# Patient Record
Sex: Female | Born: 1970 | Race: White | Hispanic: No | Marital: Married | Smoking: Never smoker
Health system: Southern US, Community
[De-identification: ages and names within clinical notes are randomized; demographics above are authoritative.]

## PROBLEM LIST (undated history)

## (undated) DIAGNOSIS — M1990 Unspecified osteoarthritis, unspecified site: Secondary | ICD-10-CM

## (undated) DIAGNOSIS — G709 Myoneural disorder, unspecified: Secondary | ICD-10-CM

## (undated) DIAGNOSIS — I10 Essential (primary) hypertension: Secondary | ICD-10-CM

## (undated) HISTORY — PX: OTHER SURGICAL HISTORY: SHX169

## (undated) HISTORY — PX: ABDOMINAL HYSTERECTOMY: SHX81

---

## 2000-11-11 ENCOUNTER — Other Ambulatory Visit: Admission: RE | Admit: 2000-11-11 | Discharge: 2000-11-11 | Payer: Self-pay | Admitting: *Deleted

## 2002-10-17 ENCOUNTER — Encounter (HOSPITAL_COMMUNITY): Admission: RE | Admit: 2002-10-17 | Discharge: 2002-11-16 | Payer: Self-pay | Admitting: Family Medicine

## 2002-10-17 ENCOUNTER — Encounter: Payer: Self-pay | Admitting: Family Medicine

## 2005-08-24 ENCOUNTER — Ambulatory Visit (HOSPITAL_COMMUNITY): Admission: RE | Admit: 2005-08-24 | Discharge: 2005-08-24 | Payer: Self-pay | Admitting: *Deleted

## 2005-09-11 ENCOUNTER — Inpatient Hospital Stay (HOSPITAL_COMMUNITY): Admission: RE | Admit: 2005-09-11 | Discharge: 2005-09-13 | Payer: Self-pay | Admitting: *Deleted

## 2005-09-11 ENCOUNTER — Encounter (INDEPENDENT_AMBULATORY_CARE_PROVIDER_SITE_OTHER): Payer: Self-pay | Admitting: Specialist

## 2006-03-22 ENCOUNTER — Other Ambulatory Visit: Admission: RE | Admit: 2006-03-22 | Discharge: 2006-03-22 | Payer: Self-pay | Admitting: Obstetrics and Gynecology

## 2007-03-08 ENCOUNTER — Other Ambulatory Visit: Admission: RE | Admit: 2007-03-08 | Discharge: 2007-03-08 | Payer: Self-pay | Admitting: Obstetrics and Gynecology

## 2008-04-01 ENCOUNTER — Emergency Department (HOSPITAL_COMMUNITY): Admission: EM | Admit: 2008-04-01 | Discharge: 2008-04-01 | Payer: Self-pay | Admitting: Emergency Medicine

## 2009-05-25 HISTORY — PX: OTHER SURGICAL HISTORY: SHX169

## 2009-06-11 ENCOUNTER — Ambulatory Visit: Payer: Self-pay | Admitting: Sports Medicine

## 2009-06-11 DIAGNOSIS — M25559 Pain in unspecified hip: Secondary | ICD-10-CM

## 2009-06-11 DIAGNOSIS — M533 Sacrococcygeal disorders, not elsewhere classified: Secondary | ICD-10-CM | POA: Insufficient documentation

## 2009-07-16 ENCOUNTER — Ambulatory Visit: Payer: Self-pay | Admitting: Sports Medicine

## 2010-01-18 ENCOUNTER — Inpatient Hospital Stay (HOSPITAL_COMMUNITY): Admission: AC | Admit: 2010-01-18 | Discharge: 2010-01-30 | Payer: Self-pay

## 2010-02-27 ENCOUNTER — Ambulatory Visit (HOSPITAL_COMMUNITY): Admission: RE | Admit: 2010-02-27 | Discharge: 2010-02-27 | Payer: Self-pay | Admitting: Physician Assistant

## 2010-06-24 NOTE — Assessment & Plan Note (Signed)
Summary: NP RUNNER R HIP PAIN X OCT   Vital Signs:  Patient profile:   40 year old female Height:      64 inches Weight:      190 pounds BMI:     32.73 BP sitting:   136 / 85  Vitals Entered By: Lillia Pauls CMA (June 11, 2009 9:16 AM)  History of Present Illness: Pt presents with right lateral hip pain that radiates down her lateral thigh. This has been present since she finished a 5K in October. She rested after the 5K and her symptoms improved. However, when she started to run again a few weeks ago, the pain came back. She is usually able to run 1.5 miles before the pain starts and is able to run to 3-4 miles before she has to stop running. She denies any runs on a hilly course or sloped roads. No prior history of trauma or surgeries to her hip or back. She started running in April of 2010.   Allergies (verified): 1)  ! Pcn  Physical Exam  General:  alert and well-developed.   Head:  normocephalic and atraumatic.   Msk:  Bilateral Hips: No bony abnormalities, edema or bruising No TTP along the trochanteric bursa or in the groin Internal rotation of 20 degrees and external rotation of 25 degrees 5/5 strength with resisted hip flexion and abduction Pain with FABER testing in the right SI joint No pain with FABER testing in the left SI joint Decreased mobility on the right with pelvic tilt testing  Back: No scoliosis.  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 Given a list of stretches to do daily for her SI joint and low back including pretzel stretches 2. Can continue training  as tolerated 3. OTC NSAIDs as needed  Problem # 2:  HIP PAIN, RIGHT (ICD-719.45) Assessment: New Radiating from her right SI joint 1. See treatment plan above 2. Low back exercises for pain 3. Can consider manipulation if she would like to in the future 4. Return in 4 weeks  Patient Instructions: 1)  Do three of the sacro-iliac stretches each day with doing the pretzel stretch daily (lying on the side of the bed with your top leg going forward over the edge of the bed and your top arm behind you to stabilize you). 2)  You can continue your training as you can tolerated. 3)  You can take an over the counter anti-inflammatory as needed (like advil or motrin). 4)  If you feel like you are getting jammed, stop and stretch.  5)  Return in 4-6 weeks for follow-up.

## 2010-06-24 NOTE — Assessment & Plan Note (Signed)
Summary: F/U,MC   Vital Signs:  Patient profile:   40 year old female BP sitting:   139 / 87  Vitals Entered By: Lillia Pauls CMA (July 16, 2009 8:53 AM)  History of Present Illness: Pt presents for follow-up of right SI joint pain. She is 70% better than she was at her last office visit. She has been able to start jogging up to 4 miles now without having to stop because of pain. She felt a big difference in her ability to run 2.5 weeks after she started the SI joint stretches. She is not having to take any medications and she does not have to use ice or heat. She was able to complete a 5K well 10 days ago without needing to stop. She still denies any numbness and tingling down her legs. She only has to stretch every other day to maintain her back.   Allergies: 1)  ! Pcn  Physical Exam  General:  alert and well-developed.   Head:  normocephalic and atraumatic.   Neck:  supple.   Lungs:  normal respiratory effort.   Msk:  Back: No bony abnormalities, edema or bruising. No scoliosis. Full ROM of her back with forward flexion, extension, side to side and rotation without pain She can walk on her heels and toes easily No TTP along her cervical, thoracic or lumbar spine No TTP over her SI joints Neg FABER testing bilaterally but tighter on the right Neg SLR bilaterally Neurovascularly intact Normal sensation of bilateral LE 5/5 strength with resisted knee flexion and extension as well as with resisted big toe extension  Gait: Forefoot varus strike with walking and running Otherwise good running form   Impression & Recommendations:  Problem # 1:  SACROILIAC JOINT DYSFUNCTION (ICD-724.6) Assessment Improved Much improved 1. Continue current SI joint stretches at least 2-3 times per week to maintain her back 2. Can continue training as needed 3. Return as needed   Problem # 2:  HIP PAIN, RIGHT (ICD-719.45) Secondary to right SI joint pain that has now improved See  treatment plan above.

## 2010-08-07 LAB — CBC
HCT: 26.2 % — ABNORMAL LOW (ref 36.0–46.0)
HCT: 28.6 % — ABNORMAL LOW (ref 36.0–46.0)
HCT: 28.7 % — ABNORMAL LOW (ref 36.0–46.0)
HCT: 29.1 % — ABNORMAL LOW (ref 36.0–46.0)
Hemoglobin: 8.8 g/dL — ABNORMAL LOW (ref 12.0–15.0)
Hemoglobin: 9.5 g/dL — ABNORMAL LOW (ref 12.0–15.0)
Hemoglobin: 9.6 g/dL — ABNORMAL LOW (ref 12.0–15.0)
MCH: 26.8 pg (ref 26.0–34.0)
MCH: 27.2 pg (ref 26.0–34.0)
MCH: 27.3 pg (ref 26.0–34.0)
MCH: 27.6 pg (ref 26.0–34.0)
MCHC: 32.9 g/dL (ref 30.0–36.0)
MCHC: 33.1 g/dL (ref 30.0–36.0)
MCHC: 33.3 g/dL (ref 30.0–36.0)
MCHC: 33.6 g/dL (ref 30.0–36.0)
MCHC: 34 g/dL (ref 30.0–36.0)
MCV: 81 fL (ref 78.0–100.0)
MCV: 81.1 fL (ref 78.0–100.0)
MCV: 81.4 fL (ref 78.0–100.0)
MCV: 81.6 fL (ref 78.0–100.0)
Platelets: 313 10*3/uL (ref 150–400)
RBC: 3.22 MIL/uL — ABNORMAL LOW (ref 3.87–5.11)
RDW: 13.2 % (ref 11.5–15.5)
RDW: 13.3 % (ref 11.5–15.5)
RDW: 13.7 % (ref 11.5–15.5)
WBC: 11.2 10*3/uL — ABNORMAL HIGH (ref 4.0–10.5)
WBC: 17.1 10*3/uL — ABNORMAL HIGH (ref 4.0–10.5)

## 2010-08-07 LAB — BASIC METABOLIC PANEL
BUN: 12 mg/dL (ref 6–23)
BUN: 5 mg/dL — ABNORMAL LOW (ref 6–23)
BUN: 6 mg/dL (ref 6–23)
BUN: 9 mg/dL (ref 6–23)
CO2: 26 mEq/L (ref 19–32)
CO2: 26 mEq/L (ref 19–32)
CO2: 26 mEq/L (ref 19–32)
CO2: 27 mEq/L (ref 19–32)
Calcium: 7.7 mg/dL — ABNORMAL LOW (ref 8.4–10.5)
Calcium: 8.4 mg/dL (ref 8.4–10.5)
Calcium: 8.5 mg/dL (ref 8.4–10.5)
Chloride: 102 mEq/L (ref 96–112)
Chloride: 103 mEq/L (ref 96–112)
Chloride: 96 mEq/L (ref 96–112)
Creatinine, Ser: 0.69 mg/dL (ref 0.4–1.2)
Creatinine, Ser: 0.78 mg/dL (ref 0.4–1.2)
GFR calc Af Amer: >60 mL/min (ref >60)
GFR calc non Af Amer: >60 mL/min (ref >60)
Glucose, Bld: 100 mg/dL — ABNORMAL HIGH (ref 70–99)
Glucose, Bld: 102 mg/dL — ABNORMAL HIGH (ref 70–99)
Glucose, Bld: 118 mg/dL — ABNORMAL HIGH (ref 70–99)
Glucose, Bld: 128 mg/dL — ABNORMAL HIGH (ref 70–99)
Glucose, Bld: 374 mg/dL — ABNORMAL HIGH (ref 70–99)
Potassium: 3.3 mEq/L — ABNORMAL LOW (ref 3.5–5.1)
Potassium: 3.7 mEq/L (ref 3.5–5.1)
Potassium: 4.8 mEq/L (ref 3.5–5.1)
Sodium: 130 mEq/L — ABNORMAL LOW (ref 135–145)
Sodium: 133 mEq/L — ABNORMAL LOW (ref 135–145)
Sodium: 136 mEq/L (ref 135–145)

## 2010-08-07 LAB — WOUND CULTURE

## 2010-08-07 LAB — MAGNESIUM: Magnesium: 1.9 mg/dL (ref 1.5–2.5)

## 2010-08-07 LAB — URINALYSIS, ROUTINE W REFLEX MICROSCOPIC
Nitrite: NEGATIVE
Specific Gravity, Urine: 1.011 (ref 1.005–1.030)
pH: 6 (ref 5.0–8.0)

## 2010-08-07 LAB — URINE CULTURE

## 2010-08-08 LAB — POCT I-STAT 7, (LYTES, BLD GAS, ICA,H+H)
Acid-base deficit: 2 mmol/L (ref 0.0–2.0)
Calcium, Ion: 0.94 mmol/L — ABNORMAL LOW (ref 1.12–1.32)
Calcium, Ion: 1.14 mmol/L (ref 1.12–1.32)
O2 Saturation: 100 %
O2 Saturation: 100 %
Patient temperature: 35.6
Patient temperature: 36.1
Potassium: 3.6 meq/L (ref 3.5–5.1)
Potassium: 4.1 meq/L (ref 3.5–5.1)
Sodium: 138 meq/L (ref 135–145)
Sodium: 139 meq/L (ref 135–145)
TCO2: 24 mmol/L (ref 0–100)
TCO2: 25 mmol/L (ref 0–100)
pCO2 arterial: 37.4 mmHg (ref 35.0–45.0)
pCO2 arterial: 43.2 mmHg (ref 35.0–45.0)
pH, Arterial: 7.39 (ref 7.350–7.400)

## 2010-08-08 LAB — COMPREHENSIVE METABOLIC PANEL
AST: 215 U/L — ABNORMAL HIGH (ref 0–37)
Alkaline Phosphatase: 54 U/L (ref 39–117)
BUN: 14 mg/dL (ref 6–23)
CO2: 22 meq/L (ref 19–32)
Chloride: 108 meq/L (ref 96–112)
Creatinine, Ser: 0.93 mg/dL (ref 0.4–1.2)
GFR calc non Af Amer: >60 mL/min (ref >60)
Potassium: 3.2 meq/L — ABNORMAL LOW (ref 3.5–5.1)
Total Bilirubin: 0.3 mg/dL (ref 0.3–1.2)

## 2010-08-08 LAB — TYPE AND SCREEN: ABO/RH(D): A POS

## 2010-08-08 LAB — BASIC METABOLIC PANEL
BUN: 13 mg/dL (ref 6–23)
BUN: 7 mg/dL (ref 6–23)
BUN: 7 mg/dL (ref 6–23)
BUN: 9 mg/dL (ref 6–23)
CO2: 25 mEq/L (ref 19–32)
CO2: 26 mEq/L (ref 19–32)
CO2: 28 mEq/L (ref 19–32)
Calcium: 7.7 mg/dL — ABNORMAL LOW (ref 8.4–10.5)
Chloride: 107 mEq/L (ref 96–112)
Creatinine, Ser: 0.64 mg/dL (ref 0.4–1.2)
Creatinine, Ser: 0.73 mg/dL (ref 0.4–1.2)
Creatinine, Ser: 0.9 mg/dL (ref 0.4–1.2)
GFR calc non Af Amer: >60 mL/min (ref >60)
Glucose, Bld: 105 mg/dL — ABNORMAL HIGH (ref 70–99)
Glucose, Bld: 122 mg/dL — ABNORMAL HIGH (ref 70–99)
Glucose, Bld: 133 mg/dL — ABNORMAL HIGH (ref 70–99)
Glucose, Bld: 173 mg/dL — ABNORMAL HIGH (ref 70–99)
Potassium: 3.7 mEq/L (ref 3.5–5.1)
Potassium: 3.9 mEq/L (ref 3.5–5.1)
Potassium: 4.2 mEq/L (ref 3.5–5.1)
Sodium: 131 mEq/L — ABNORMAL LOW (ref 135–145)

## 2010-08-08 LAB — CBC
HCT: 26.5 % — ABNORMAL LOW (ref 36.0–46.0)
HCT: 27.7 % — ABNORMAL LOW (ref 36.0–46.0)
HCT: 29.6 % — ABNORMAL LOW (ref 36.0–46.0)
Hemoglobin: 12.2 g/dL (ref 12.0–15.0)
Hemoglobin: 8.9 g/dL — ABNORMAL LOW (ref 12.0–15.0)
MCH: 27.2 pg (ref 26.0–34.0)
MCH: 27.6 pg (ref 26.0–34.0)
MCH: 27.8 pg (ref 26.0–34.0)
MCH: 28.1 pg (ref 26.0–34.0)
MCHC: 33.2 g/dL (ref 30.0–36.0)
MCHC: 33.3 g/dL (ref 30.0–36.0)
MCHC: 33.6 g/dL (ref 30.0–36.0)
MCHC: 34.1 g/dL (ref 30.0–36.0)
MCV: 79.6 fL (ref 78.0–100.0)
MCV: 80.6 fL (ref 78.0–100.0)
Platelets: 183 10*3/uL (ref 150–400)
Platelets: 207 10*3/uL (ref 150–400)
Platelets: 243 10*3/uL (ref 150–400)
RBC: 4.34 MIL/uL (ref 3.87–5.11)
RDW: 13.6 % (ref 11.5–15.5)
RDW: 13.6 % (ref 11.5–15.5)
RDW: 13.9 % (ref 11.5–15.5)
WBC: 12.6 10*3/uL — ABNORMAL HIGH (ref 4.0–10.5)
WBC: 20.4 10*3/uL — ABNORMAL HIGH (ref 4.0–10.5)

## 2010-08-08 LAB — PROTIME-INR: INR: 1.14 (ref 0.00–1.49)

## 2010-08-08 LAB — PREPARE FRESH FROZEN PLASMA

## 2010-09-10 ENCOUNTER — Encounter (HOSPITAL_COMMUNITY)
Admission: RE | Admit: 2010-09-10 | Discharge: 2010-09-10 | Disposition: A | Discharge status: Home / Self Care | Payer: BC Managed Care – PPO | Source: Ambulatory Visit | Attending: General Surgery | Admitting: General Surgery

## 2010-09-10 ENCOUNTER — Other Ambulatory Visit (HOSPITAL_COMMUNITY): Payer: Self-pay | Admitting: General Surgery

## 2010-09-10 DIAGNOSIS — K469 Unspecified abdominal hernia without obstruction or gangrene: Secondary | ICD-10-CM

## 2010-09-10 LAB — CBC
MCV: 79.7 fL (ref 78.0–100.0)
Platelets: 282 10*3/uL (ref 150–400)
RDW: 12.9 % (ref 11.5–15.5)
WBC: 9.1 10*3/uL (ref 4.0–10.5)

## 2010-09-10 LAB — SURGICAL PCR SCREEN: Staphylococcus aureus: NEGATIVE

## 2010-09-10 LAB — BASIC METABOLIC PANEL
BUN: 14 mg/dL (ref 6–23)
GFR calc non Af Amer: >60 mL/min (ref >60)
Potassium: 3.8 mEq/L (ref 3.5–5.1)
Sodium: 135 mEq/L (ref 135–145)

## 2010-09-15 ENCOUNTER — Observation Stay (HOSPITAL_COMMUNITY)
Admission: RE | Admit: 2010-09-15 | Discharge: 2010-09-17 | Disposition: A | Discharge status: Home / Self Care | Payer: BC Managed Care – PPO | Source: Ambulatory Visit | Attending: General Surgery | Admitting: General Surgery

## 2010-09-15 DIAGNOSIS — K432 Incisional hernia without obstruction or gangrene: Principal | ICD-10-CM | POA: Insufficient documentation

## 2010-09-15 DIAGNOSIS — Z5331 Laparoscopic surgical procedure converted to open procedure: Secondary | ICD-10-CM | POA: Insufficient documentation

## 2010-09-15 DIAGNOSIS — Z01812 Encounter for preprocedural laboratory examination: Secondary | ICD-10-CM | POA: Insufficient documentation

## 2010-09-29 NOTE — Discharge Summary (Signed)
  NAMEKRISTOPHER, [name]               ACCOUNT NO.:  [id number]  MEDICAL RECORD NO.:  [id number]           PATIENT TYPE:  O  LOCATION:  5151                         FACILITY:  [facility]  PHYSICIAN:  Cherylynn Ridges, M.D.    DATE OF BIRTH:  Sep 09, 1970  DATE OF ADMISSION:  09/15/2010 DATE OF DISCHARGE:  09/17/2010                              DISCHARGE SUMMARY   DISCHARGE DIAGNOSIS:  Ventral hernia.  PRINCIPAL PROCEDURE:  Open ventral hernia repair with Physio mesh. Surgeon was Dr. Lindie Spruce.  DISPOSITION:  She is discharged to home in care of her family.  She has an abdominal binder in place which she is to wear anytime she is ambulating.  She also has a Blake drain in her subcutaneous tissue above the fascia which will be cared for by the family.  No home health is necessary.  We will probably remove that next week.  She is return to see me on Tuesday, I believe that is Sep 23, 2010 for staple removal and likely removal of drain.  She is stable on discharge.  She is afebrile and eating well.  BRIEF SUMMARY OF HOSPITAL COURSE:  The patient was admitted postoperatively after an attempted laparoscopic converted to an open ventral hernia repair with Physio mesh.  The patient had a large central fascial defect which was closed on top of an inlay piece of Physio mesh.  Postoperatively on day #1, she had her Foley removed.  She has been able to void well since that time.  Because she had a Blake drain in place and was draining significantly, we left it in place and just got the patient used to her pain with ambulation and binder and so forth.  On postoperative day #2, she is eating well, passing gas, and no bowel movement yet.  Her wound looks good with no evidence of necrosis or infection.  Her Harrison Mons drain is still working very well.  She is to return to clinic to see me on Tuesday for staple removal and drain removal.     Cherylynn Ridges, M.D.     JOW/MEDQ  D:  09/17/2010  T:  09/18/2010   Job:  [id number]  Electronically Signed by Jimmye Norman M.D. on 09/29/2010 04:05:07 PM

## 2010-09-29 NOTE — Op Note (Signed)
[name], [name]               ACCOUNT NO.:  [id number]  MEDICAL RECORD NO.:  [id number]           PATIENT TYPE:  O  LOCATION:  5151                         FACILITY:  [facility]  PHYSICIAN:  Cherylynn Ridges, M.D.    DATE OF BIRTH:  May 28, 1970  DATE OF PROCEDURE:  09/15/2010 DATE OF DISCHARGE:                              OPERATIVE REPORT   PREOPERATIVE DIAGNOSIS:  Incisional ventral hernia.  POSTOPERATIVE DIAGNOSIS:  A 15 x 20 cm ventral incisional hernia.  PROCEDURE:  Aborted laparoscopic and open ventral hernia repair with mesh.  SURGEON:  Marta Lamas. Lindie Spruce, MD  ANESTHESIA:  General endotracheal.  ESTIMATED BLOOD LOSS:  250-300 mL.  COMPLICATIONS:  None.  CONDITION:  Stable.  FINDINGS:  Very large separated fascia of ventral hernia in the upper portion of previous abdominal incision for trauma.  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  OPERATION:  The patient was taken to the operating room and placed on table in supine position.  After an adequate general endotracheal anesthetic was administered, she was prepped and draped in usual sterile manner exposing the upper midline and the entire abdomen.  After proper time-out was performed identifying the patient and procedure to be done, we made an incision in the right upper quadrant about 1.2 cm in length down into the subcutaneous tissue.  It was there where we used an OptiVu cannula, 11/12 mm cannula with an inserted laparoscope, attached camera, and light source and carefully went into the peritoneal cavity.  We easily able to get in without injuring any of the small bowel or bowel structures and then we were able to insufflate with carbon dioxide gas up to maximal pressure of 50 mmHg.  With the laparoscope, attached camera, and light source, we were able to identify that this is a very widely separated fascial defects  with pieces of omentum and colon involved in the hernia defect.  With the size of this defect and the amount of adhesions to the anterior abdominal wall, it was felt so as probably better to fix it openly and we did so.  We removed the camera, cut out the insufflation but left the gas in place as we made our midline incision after appropriate remnants were encountered.  The midline incision was made excising the previous sort of hypertrophic scar in the upper portion of the wound down to the umbilicus.  We took it down into the subcutaneous tissue and then into the peritoneal cavity where we initially got into the hernia sac.  We excised the keloid tissue.  We were able to identify the fascia circumferentially.  Inferiorly, there was a small defect around the umbilicus which we also incorporated into the ventral hernia repair.  We made flaps laterally on both sides using electrocautery identifying the fascia.  The fascial opening where the trocar went in on the right side was closed off using a figure-of-eight stitch of 0 Vicryl.  There wasalso a fascial laceration made with the cautery which was repaired also.  Once we had this  repaired, we dissected out flaps circumferentially.  We measured the size of the defect, it was about 15 x 20 cm.  The decision was made to use a 20 x 25 cm piece of Physiomesh which we attached circumferentially after cutting off about a centimeter and half circumferentially leaving it in an oval shape.  We attached it with using horizontal mattress U stitches circumferentially using approximately 32 stitches to attach it to the underlying surface of the fascia.  Once we had it completely in place, we had to place a couple of tacking stitches to cut off any areas of fascia that were not attached to the mesh.  We then reapproximated the fascia on top of the mesh using interrupted #1 Novafil sutures.  We irrigated with saline solution and placed a 19 [place] Blake  drain in the subcutaneous tissue which we brought out through the subcu side of the trocar.  We stapled out most of the trocar site and sutured the drain in place using a 2-0 nylon suture.  We irrigated and soaked the mesh in antibiotic solution.  We reapproximated the subcu using running 3-0 Vicryl suture.  Then we closed the skin using stainless steel staples.  All counts were correct including needles, sponges and instruments.  A sterile dressing applied using antibiotic ointment and 4x4s.     Cherylynn Ridges, M.D.     JOW/MEDQ  D:  09/15/2010  T:  09/16/2010  Job:  [id number]  Electronically Signed by Jimmye Norman M.D. on 09/29/2010 04:05:11 PM

## 2010-10-10 NOTE — Discharge Summary (Signed)
[name], [name]               ACCOUNT NO.:  [id number]   MEDICAL RECORD NO.:  [id number]          PATIENT TYPE:  INP   LOCATION:  A418                          FACILITY:  [facility]   PHYSICIAN:  Langley Gauss, MD     DATE OF BIRTH:  Aug 21, 1970   DATE OF ADMISSION:  09/11/2005  DATE OF DISCHARGE:  04/22/2007LH                                 DISCHARGE SUMMARY   PROCEDURE PERFORMED:  TAH and left ovarian cystectomy.   DISPOSITION:  At time of discharge the Foley catheter was out, the patient  is fully ambulatory.  JP drain and subcutaneous fat retention sutures are  removed.  The patient is given copy of standard discharge instructions.  She  is to follow up in the office in three to four days' time for removal of  staples from the Pfannenstiel incision.   DISCHARGE MEDICATIONS:  Tylox #30 without refill for pain relief.   PERTINENT LABORATORY STUDIES:  A+ blood type.  Admission  hemoglobin/hematocrit 14 and 40.9 with a negative HCG.  Postoperative day  #1, 10.4 and 30.3 with a white count of 8.   HOSPITAL COURSE:  TAH and left ovarian cystectomy performed without  complication on September 11, 2005.  The patient did well postoperatively.  Vital signs remained stable.  She had a Foley catheter in place which was  removed on postop day #1, after which time the patient was fully ambulatory  and able to void without difficulty.  She continued to have stable vital  signs.  Advance her diet.  On postop day #2, the patient has resumption of  bowel function with passage of flatus, doing well with p.o. narcotics, no  complaints of hot flashes.  Incision appears to be well approximated with no  erythema or induration.  The patient discharged home on postoperative day  #2.      Langley Gauss, MD  Electronically Signed     DC/MEDQ  D:  09/20/2005  T:  09/21/2005  Job:  [id number]

## 2010-10-10 NOTE — Op Note (Signed)
[name], [name]               ACCOUNT NO.:  [id number]   MEDICAL RECORD NO.:  [id number]          PATIENT TYPE:  INP   LOCATION:  A418                          FACILITY:  [facility]   PHYSICIAN:  Langley Gauss, MD     DATE OF BIRTH:  1971/05/21   DATE OF PROCEDURE:  09/11/2005  DATE OF DISCHARGE:  09/13/2005                                 OPERATIVE REPORT   PREOPERATIVE DIAGNOSES:  1.  Menorrhagia.  2.  Dysmenorrhea.  3.  Uterine leiomyoma.   PROCEDURE PERFORMED:  Total abdominal hysterectomy and left ovarian  cystectomy.   SURGEON:  Langley Gauss, M.D.   ESTIMATED BLOOD LOSS:  300 mL.   ANESTHESIA:  General endotracheal.   DRAINS:  Foley catheter sterilely draining clear, yellow urine from the  bladder.  JP drain was placed in a subcutaneous space.   FINDINGS AT SURGERY:  Include a nulliparous uterus with at least a one small  fibroid identified on the exterior surface but with somewhat globular fundus  noted.  Incidental finding was hemorrhagic ovarian cyst which was noted to  incidentally rupture, and there was noted to be a small amount of active  bleeding.  A portion of the left ovarian cyst wall was then removed, and a  pursestring suture was used to close the defect and control the bleeding.   SUMMARY:  Planned procedure discussed with the patient in the preoperative  holding area.  On previous discussions, she was adamant regarding her desire  to retain her ovaries and avoid surgical menopause and also thus avoid use  of hormone replacement therapy.  The patient's vital signs were stable.  She  was taken to the operating room and underwent uncomplicated induction of  general anesthesia, then prepped and draped usual sterile manner.  Foley  catheter was sterilely placed to straight drainage, findings of clear,  yellow urine.  A knife was used to incise the previous Pfannenstiel incision  and was dissected down to the fascial plane utilizing the knife and cautery,  cauterizing all bleeders along the way.  Of note was a significant amount of  venous oozing from the subcutaneous fatty tissue edges as well as the skin  edges.  These were cauterized.  The fascia was then incised in a transverse  curvilinear manner while sharply dissecting off the underlying rectus muscle  in the avascular plane.  Fascial edges were grasped using straight Kocher  clamps, incised, and the fascia then dissected off the underlying rectus  muscle in the midline both superiorly and inferiorly in the avascular plane.  Rectus muscles then bluntly separated.  Peritoneal cavity was atraumatically  bluntly entered at the superior-most portion of the incision.  Peritoneal  incision extended superiorly and inferiorly.  Inferiorly, we palpated the  bladder to avoid accidental injury.  Findings as described above.  Self-  retaining Balfour retractor was then placed.  Bladder blade was also placed.  One moist pack was used to mobilize bowel out of the operative field.  Straight Kocher clamps were used to grasp the specimen at the junction of  the round ligament and  fallopian tube with the uterus.  This allowed  manipulation throughout the procedure.  A Kelly clamp was placed on the  right round ligament to control back bleeding, followed by 0 Vicryl suture  in a Heaney fashion.  Right round ligament was transected, and the utero-  ovarian ligament on the right was skeletonized.  Utero-ovarian ligament was  then doubly clamped with Kelly clamps.  Transection was then performed  between the two, and this pedicle was then doubly ligated first with a free  tie of 0 Vicryl, followed by suture ligature of 0 Vicryl in a Heaney  fashion.  On the patient's left, procedure was performed identically.  There  was noted to be some omental adhesions to the fundus from previous surgery  which were sharply dissected free.  The densest of these, however, was  ligated by placing Kelly clamps and then two  free ties of 0 Vicryl to secure  the edges.  This mobilized omentum off of the fundal portion of the uterus.  Left round ligament was clamped with a Kelly clamp.  Suture ligature of 0  Vicryl in Heaney fashion was then utilized to secure the left round  ligament.  The left round ligament was transected.  Skeletization of the  utero-ovarian ligament was then performed.  Utero-ovarian ligament was then  doubly clamped with Kelly clamps followed by double ligation, first with  free tie of 0 Vicryl, followed by suture ligature of 0 Vicryl in a Heaney  fashion.  Of note, during manipulation of the left ovary, there was noted to  be incidental rupture of hemorrhagic ovarian cysts with initial evacuation  of a very small clot, followed by some active bleeding which was minimal in  nature and thus was at a later time.  On each side then, the uterine vessels  were skeletonized in the avascular plane, and the dissection was continued  across the anterior lower segment in the avascular plane.  The bladder flap  was then pushed distal to the cervix.  The right uterine vessels identified,  and Kelly clamp was placed to control back-bleeding.  Suture ligature of 0  Vicryl was then placed after curved Heaney was placed.  This secured the  right uterine vessel.  The left uterine vessel was identified.  A curved  Heaney clamp was placed on the left uterine vessel close to the cervix at  the junction of the cervix and the body of the uterus.  Left uterine vessel  was secured with 0 Vicryl in a single interrupted fashion.  This resulted in  hemostasis and interruption of the major vascular supply to the uterus  itself.  The right cardinal ligament was then identified.  A straight Heaney  clamp was placed on this very close to the cervix.  This was then ligated  with 0 Vicryl in a Heaney fashion.  Left cardinal ligament was likewise  clamped with a curved Heaney clamp, placed very close to the cervix.  A  0 Vicryl suture in a Heaney fashion was placed at this site, and this was also  tagged for later inspection but then cut.  The right uterosacral ligament  was clamped with a curved Heaney clamp, followed by suture ligature of 0  Vicryl in a Heaney fashion.  This was tagged for later inspection.  Left  uterosacral ligament identified and clamped with a curved Haney clamp,  followed by suture ligature of 0 Vicryl in a Heaney fashion.  These are  tagged for later  inspection.  This allowed me to cut across the vaginal  cuff, maintaining maximum vaginal length but removing the entirety of the  cervix with the uterus.  The edges of the vaginal cuff were then grasped  with straight Kochers.  Irrigation of the pelvic cavity and upper vagina was  then performed with a Betadine solution.  The vaginal cuff was then closed  utilizing figure-of-eight sutures of 0 Vicryl to secure hemostasis.  Hemostasis being assured and our ureters noted to be in their normal  anatomic position along the lateral pelvic sidewall, I was able to complete  the closure.  Balfour retractor was removed as well as the moist pack.  Sponge and instrument counts were correct times two at this point.  In an  effort to avoid subsequent dyspareunia, the ovaries were slightly elevated  out of the pelvis by placing a single suture through the ovary and attaching  this to the round ligament.  No tension was applied in the process of doing  this.  This was noted to slightly suspend the ovaries and keep them from  falling into the cul-de-sac.  A pursestring suture was placed on the left  ovary after removal of a small portion of the left ovarian cyst wall to  control hemostasis.  The procedure was now terminated with sponge, needle,  and instrument counts correct.  All of our instruments were removed.  Peritoneal edges were grasped using Kelly clamps, and the peritoneum was  closed with a continuous running 0 chromic suture.  Likewise,  the rectus  muscle overlying this was then closed with a continuous running 0 chromic.  Significant amount of oozing had been occurring from the rectus muscle  throughout the procedure, which required meticulous cauterization of these  and pressure.  Surgicel had been placed around the ovaries in an effort to  prevent postoperative adhesion formation.  Surgicel was also placed in the  subfascial area in an effort to assure hemostasis.  This having been done,  the fascia was then closed with a continuous running looped #1 PDS suture.  Subcutaneous bleeders were cauterized.  JP drain was placed in subcutaneous  space, with a separate exit wound to the left apex of the incision.  This  was sutured into place.  Three horizontal mattress sutures of #1 PDS were  then placed as retention type sutures.  The skin was then reapproximated  utilizing skin staples.  Total of 30 mL of 0.5% bupivacaine plain was then  injected along the entirety of the incision to facilitate postoperative anesthesia.  The patient tolerated the procedure well.  She continued to  drain clear, yellow urine.  She was extubated and taken to the recovery room  in stable condition.  I spoke with the patient's family f the findings at  time of surgery including the very easy of oozing from the tissue edges.  Question at that point in time was whether the dietary supplements through  [facility] that the patient had been taking may have had an effect on  it, and I advised him that without knowing the exact ingredients in these, I  certainly would not be surprised if it did have some blood-thinning  properties, and they should have been discontinued much prior.      Langley Gauss, MD  Electronically Signed     DC/MEDQ  D:  09/11/2005  T:  09/13/2005  Job:  [id number]

## 2010-10-10 NOTE — H&P (Signed)
NAME:  [name], [name]               ACCOUNT NO.:  [id number]   MEDICAL RECORD NO.:  [id number]          PATIENT TYPE:  AMB   LOCATION:  DAY                           FACILITY:  [facility]   PHYSICIAN:  Langley Gauss, MD     DATE OF BIRTH:  1971-03-31   DATE OF ADMISSION:  09/11/2005  DATE OF DISCHARGE:  LH                                HISTORY & PHYSICAL   ADDENDUM:  Past medical history pertinent for unruptured appendectomy at age  40. T&A at age 62. Patient had nasal septal repair in 2000.  She underwent  breast reduction surgery February 23, 2005.   She has recently taken Megace but this has been discontinued due to the  scalp lesions, she is currently on Bactrim and these are resolving fairly  rapidly.      Langley Gauss, MD  Electronically Signed     DC/MEDQ  D:  09/10/2005  T:  09/10/2005  Job:  [id number]   cc:   [facility] Day [facility] Unit

## 2010-10-10 NOTE — H&P (Signed)
NAME:  [name], [name]               ACCOUNT NO.:  [id number]   MEDICAL RECORD NO.:  [id number]          PATIENT TYPE:  AMB   LOCATION:  DAY                           FACILITY:  [facility]   PHYSICIAN:  Langley Gauss, MD     DATE OF BIRTH:  1971-02-20   DATE OF ADMISSION:  09/11/2005  DATE OF DISCHARGE:  LH                                HISTORY & PHYSICAL   PLANNED PROCEDURE:  Total abdominal hysterectomy.   CHIEF COMPLAINT:  Menometrorrhagia and she is noted to have uterine  fibroids.  Specifically, the patient states her menses are irregular, that  she experiences 5-7 days of flow with passage of clots, occasionally has to  wear both a pad and a tampon.  In addition, she experiences significant  pelvic cramping during these episodes.  The patient had previously been on  Estrostep since March 2005, up to the past six months, her menses has not  been much of a problem, but subsequently, they have become very problematic  in that they interfere with her daily functioning and, at times, have kept  her nearly bedridden.  She has tried non-steroidal anti-inflammatory agents  with minimal improvement in the cramps and minimal decrease in the total  flow.  She currently continues to take Estrostep without any interruption to  August 12, 2005, however at that point in time, the Estrostep was not having  any significant therapeutic advantage to her, so it was discontinued and she  was started on Megace 20 mg p.o. daily suppressive therapy.  However, she  had significant problems with the Megace, most specifically development of  inclusion cysts on her scalp.  Thus, at present, she is not on any birth  control.  She is absolutely certain regarding her desire for no child  bearing potential.  She has no living children.  Her husband is in agreement  with this.  Pertinently, she does have PMS symptoms prior to the onset of  each of these episodes of vaginal bleeding.  She does have a history of  hypertension.  She does take Procardia XL 120 mg p.o. daily.  She also takes  Claritin for seasonal rhinitis.  She is also currently on Bactrim for  treatment of the scalp cysts.   ALLERGIES:  She states she is allergic to PENICILLIN, but has previously  tolerated cephalosporins without difficulty.   PAST MEDICAL HISTORY:  Pertinent for prior finding of a pelvic mass October 31, 1998.  Ultrasound at that time was very impressive for a large right adnexal  mass, 8.9 cm in diameter.  Laparotomy was performed with findings of a  pedunculated fibroid which was excised.  Subsequently, the patient had no  menstrual complaints until the past six months.   REVIEW OF SYSTEMS:  CONSTITUTIONAL:  The patient is experiencing intentional  weight loss, having lost at least 40 pounds.  ENDOCRINE:  She denies any hot  flashes.  CARDIOVASCULAR:  She does describe a history of hypertension.  No  history of diabetes.   FAMILY HISTORY:  Pertinent for a history of thyroid disease.   SOCIAL  HISTORY:  The patient is a non-smoker.  She is employed at [employer].  Her husband is named Fish farm manager.   PHYSICAL EXAMINATION:  GENERAL:  She is noted to be a very pleasant white female.  VITAL SIGNS:  Blood pressure 155/97, pulse 91.  Recent blood pressure was  124/72.  Weight 195 pounds.  HEENT:  Negative, there is no adenopathy.  NECK:  Supple, thyroid not palpable.  LUNGS:  Clear.  HEART:  Regular rate and rhythm.  ABDOMEN:  Soft, nontender.  She does have Pfannenstiel incision from prior  surgical removal of pedunculated fibroid.  EXTREMITIES:  Normal.  PELVIC EXAMINATION:  A diffusely enlarged uterus with a very prominent  fundal portion.  There is a very prominent fundus on palpation which is some  of the pain the patient experiences with onset of her menses.  Endometrial  stripe is normal at 6 mm.  The fibroid seated within the fundus measures 1.7  x 1.3 x 1.5 cm.   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  This is sufficiently  serious enough symptomatically at this time that she desires definitive  surgical management.  Pertinently, she is gravida 0, para 0, but she is  absolutely certain regarding her decision for no child bearing potential and  husband is in agreement with this.  She has never had any problems with her  ovaries, most specifically, no prior hemorrhagic cysts or adnexal type pain.  Thus, the plan is to proceed with total abdominal hysterectomy,  specifically, removal of the uterus and cervix.  The ovaries will be left in  place.  Attempts will be made to suspend them from the round ligaments to  leave them in their normal anatomic position and to avoid dyspareunia.  The  risks and benefits of the surgical procedure were discussed with the patient  to include the risks of bowel or bladder injury.  She is advised that this  is to be considered a very low risk for requiring transfusion.      Langley Gauss, MD  Electronically Signed     DC/MEDQ  D:  09/10/2005  T:  09/10/2005  Job:  [id number]

## 2011-01-29 ENCOUNTER — Other Ambulatory Visit (HOSPITAL_COMMUNITY): Payer: Self-pay | Admitting: Orthopedic Surgery

## 2011-01-29 DIAGNOSIS — S52599A Other fractures of lower end of unspecified radius, initial encounter for closed fracture: Secondary | ICD-10-CM

## 2011-02-02 ENCOUNTER — Ambulatory Visit (HOSPITAL_COMMUNITY)
Admission: RE | Admit: 2011-02-02 | Discharge: 2011-02-02 | Disposition: A | Discharge status: Home / Self Care | Payer: BC Managed Care – PPO | Source: Ambulatory Visit | Attending: Orthopedic Surgery | Admitting: Orthopedic Surgery

## 2011-02-02 DIAGNOSIS — Z9889 Other specified postprocedural states: Secondary | ICD-10-CM | POA: Insufficient documentation

## 2011-02-02 DIAGNOSIS — S52599A Other fractures of lower end of unspecified radius, initial encounter for closed fracture: Secondary | ICD-10-CM

## 2011-02-02 DIAGNOSIS — IMO0002 Reserved for concepts with insufficient information to code with codable children: Secondary | ICD-10-CM | POA: Insufficient documentation

## 2011-02-02 DIAGNOSIS — M25539 Pain in unspecified wrist: Secondary | ICD-10-CM | POA: Insufficient documentation

## 2011-03-03 ENCOUNTER — Other Ambulatory Visit (HOSPITAL_COMMUNITY): Payer: Self-pay | Admitting: Obstetrics and Gynecology

## 2011-03-03 DIAGNOSIS — Z139 Encounter for screening, unspecified: Secondary | ICD-10-CM

## 2011-03-09 ENCOUNTER — Ambulatory Visit (HOSPITAL_COMMUNITY)
Admission: RE | Admit: 2011-03-09 | Discharge: 2011-03-09 | Disposition: A | Discharge status: Home / Self Care | Payer: BC Managed Care – PPO | Source: Ambulatory Visit | Attending: Obstetrics and Gynecology | Admitting: Obstetrics and Gynecology

## 2011-03-09 DIAGNOSIS — Z139 Encounter for screening, unspecified: Secondary | ICD-10-CM

## 2011-03-09 DIAGNOSIS — Z1231 Encounter for screening mammogram for malignant neoplasm of breast: Secondary | ICD-10-CM | POA: Insufficient documentation

## 2011-03-12 ENCOUNTER — Other Ambulatory Visit: Payer: Self-pay | Admitting: Obstetrics and Gynecology

## 2011-03-12 DIAGNOSIS — R928 Other abnormal and inconclusive findings on diagnostic imaging of breast: Secondary | ICD-10-CM

## 2011-03-25 ENCOUNTER — Ambulatory Visit (HOSPITAL_COMMUNITY)
Admission: RE | Admit: 2011-03-25 | Discharge: 2011-03-25 | Disposition: A | Discharge status: Home / Self Care | Payer: BC Managed Care – PPO | Source: Ambulatory Visit | Attending: Obstetrics and Gynecology | Admitting: Obstetrics and Gynecology

## 2011-03-25 DIAGNOSIS — R928 Other abnormal and inconclusive findings on diagnostic imaging of breast: Secondary | ICD-10-CM

## 2011-04-15 ENCOUNTER — Encounter (HOSPITAL_COMMUNITY): Payer: BC Managed Care – PPO

## 2011-08-17 ENCOUNTER — Ambulatory Visit: Payer: BC Managed Care – PPO | Attending: Neurology | Admitting: Sleep Medicine

## 2011-08-17 DIAGNOSIS — G4730 Sleep apnea, unspecified: Secondary | ICD-10-CM

## 2011-08-17 DIAGNOSIS — Z6832 Body mass index (BMI) 32.0-32.9, adult: Secondary | ICD-10-CM | POA: Insufficient documentation

## 2011-08-17 DIAGNOSIS — G4710 Hypersomnia, unspecified: Secondary | ICD-10-CM | POA: Insufficient documentation

## 2011-08-22 NOTE — Procedures (Signed)
NAME:  [name], MASK               ACCOUNT NO.:  [id number]  MEDICAL RECORD NO.:  [id number]          PATIENT TYPE:  OUT  LOCATION:  SLEEP LAB                     FACILITY:  [facility]  PHYSICIAN:  Amarian Botero A. Gerilyn Pilgrim, M.D. DATE OF BIRTH:  1970/07/08  DATE OF STUDY:  08/17/2011                           NOCTURNAL POLYSOMNOGRAM  REFERRING PHYSICIAN:  [name] A. Gerilyn Pilgrim, M.D.  INDICATION FOR STUDY:  A 41 year old who presents with fatigue, snoring, hypertension, and obesity.  The study is being done to evaluate for obstructive sleep apnea syndrome.  EPWORTH SLEEPINESS SCORE: 1. BMI 32.  MEDICATIONS:  Lisinopril, nifedipine, aspirin.  SLEEP ARCHITECTURE:  The total recording time is 403 minutes, sleep efficiency 88%, sleep latency 12 minutes, REM latency 182 minutes. Stage N1 7%, N2 65%, N3 14%, and REM sleep 14%.  RESPIRATORY DATA:  Baseline oxygen saturation is 98, lowest saturation 92 during both non-REM sleep and REM sleep.  Diagnostic AHI is 1 and RDI 6.  CARDIAC DATA:  Average heart rate is 69 with no significant dysrhythmias observed.  MOVEMENT-PARASOMNIA:  PLM index 0.  IMPRESSIONS-RECOMMENDATIONS:  Unremarkable nocturnal polysomnography.     [name] A. Gerilyn Pilgrim, M.D.    KAD/MEDQ  D:  08/22/2011 15:59:39  T:  08/22/2011 22:58:31  Job:  [id number]

## 2013-03-08 ENCOUNTER — Telehealth (HOSPITAL_COMMUNITY): Payer: Self-pay | Admitting: Emergency Medicine

## 2013-03-08 NOTE — Telephone Encounter (Signed)
Called pt back and she has questions regarding her previous surgery. Let her know I need to pull her chart from storage and will call her back once I receive it. Advised may take a day or so.

## 2013-03-14 NOTE — Telephone Encounter (Signed)
Tried calling patient back but no answer or VM to leave msg. Received chart from storage. Read over previous notes and could not see anything regarding damage to her pancreas or spleen. There was a laceration of her liver that was closed by suture.

## 2013-03-29 ENCOUNTER — Telehealth (HOSPITAL_COMMUNITY): Payer: Self-pay | Admitting: Emergency Medicine

## 2013-03-29 NOTE — Telephone Encounter (Signed)
Left message directing her to call [facility] directly if her question involved her VHR in 2012. Otherwise she should call me back.

## 2017-12-07 ENCOUNTER — Ambulatory Visit: Payer: Self-pay | Admitting: Orthopedic Surgery

## 2017-12-07 NOTE — H&P (Signed)
[name] is an 47 y.o. female.   Chief Complaint: back and leg pain HPI: Reported by patient. Reason for Visit: Diagnositc Results (lumbar MRI)  Context: The patient is 10 weeks out from when symptoms began.  Location (Lower Extremity): leg pain bilateral, ,  Severity: pain level 7/10  Timing: constant  Alleviating Factors: changing positions  Associated Symptoms: numbness/tingling (right lower extremity)  Medications: The patient is taking Gabapentin and Vimovo.  Past Medical Hx HTN  Past surgical hx ORIF - left wrist - then removal of hardwre Splenectomy Adenoid Surgery Reduction mammoplasty Appendectomy  Social Hx Smoking Status: Never smoker Occupation: Special education teachers Alcohol intake: Occasional Hand Dominance: Right Work related injury?: N Advance directive: N  Family Hx Father - Hypertensive disorder Mother - Diabetes mellitus  Allergies:  Allergies  Allergen Reactions  . Penicillins Rash    Rash as a child. No swelling or anaphylactic reaction.   Medications fluticasone propionate 50 mcg/actuation nasal spray,suspension gabapentin 300 mg capsule lisinopril 5 mg tablet NIFEdipine ER 60 mg tablet,extended release 24 hr oxyCODONE-acetaminophen 5 mg-325 mg tablet Vimovo 500 mg-20 mg tablet,immediate and delay release  Review of Systems  Constitutional: Negative.   HENT: Negative.   Eyes: Negative.   Respiratory: Negative.   Cardiovascular: Negative.   Gastrointestinal: Negative.   Genitourinary: Negative.   Musculoskeletal: Positive for back pain.  Skin: Negative.   Neurological: Positive for sensory change and focal weakness.  Psychiatric/Behavioral: Negative.     There were no vitals taken for this visit. Physical Exam  Constitutional: She is oriented to person, place, and time. She appears well-developed and well-nourished.  HENT:  Head: Normocephalic.  Eyes: Pupils are equal, round, and reactive to light.  Neck: Normal range  of motion.  Cardiovascular: Normal rate.  Respiratory: Effort normal.  GI: Soft.  Musculoskeletal:  Patient is a 47 year old female.  Gait and Station: Appearance: ambulating with no assistive devices and antalgic gait.  Constitutional: General Appearance: healthy-appearing and distress (mild).  Psychiatric: Mood and Affect: active and alert.  Cardiovascular System: Edema Right: none; Dorsalis and posterior tibial pulses 2+. Edema Left: none.  Abdomen: Inspection and Palpation: non-distended and no tenderness.  Skin: Inspection and palpation: no rash.  Lumbar Spine: Inspection: normal alignment. Bony Palpation of the Lumbar Spine: tender at lumbosacral junction.. Bony Palpation of the Right Hip: no tenderness of the greater trochanter and tenderness of the SI joint; Pelvis stable. Bony Palpation of the Left Hip: no tenderness of the greater trochanter and tenderness of the SI joint. Soft Tissue Palpation on the Right: No flank pain with percussion. Active Range of Motion: limited flexion and extention.  Motor Strength: L1 Motor Strength on the Right: hip flexion iliopsoas 5/5. L1 Motor Strength on the Left: hip flexion iliopsoas 5/5. L2-L4 Motor Strength on the Right: knee extension quadriceps 5/5. L2-L4 Motor Strength on the Left: knee extension quadriceps 5/5. L5 Motor Strength on the Right: ankle dorsiflexion tibialis anterior 5/5 and great toe extension extensor hallucis longus 5/5. L5 Motor Strength on the Left: ankle dorsiflexion tibialis anterior 5/5 and great toe extension extensor hallucis longus 5/5. S1 Motor Strength on the Right: plantar flexion gastrocnemius 5/5. S1 Motor Strength on the Left: plantar flexion gastrocnemius 5/5.  Neurological System: Knee Reflex Right: normal (2). Knee Reflex Left: normal (2). Ankle Reflex Right: normal (2). Ankle Reflex Left: normal (2). Babinski Reflex Right: plantar reflex absent. Babinski Reflex Left: plantar reflex absent. Sensation on the  Right: normal distal extremities. Sensation on the  Left: normal distal extremities. Special Tests on the Right: no clonus of the ankle/knee and seated straight leg raising test positive. Special Tests on the Left: no clonus of the ankle/knee and seated straight leg raising test positive.  Trace EHL weakness is noted bilaterally. Normal cervical lordosis. No pain with range of motion. No palpable tenderness. Motor is 5/5 in all groups in the upper extremities. Upper extremity sensory exam normal. Patient is normoreflexic in the upper extremities. No Hoffmann sign.  Neurological: She is alert and oriented to person, place, and time.  Skin: Skin is warm and dry.     Assessment/Plan Spinal stenosis  Patient demonstrates persistent bilateral L5 radiculopathy secondary to multifactorial spinal stenosis at L4-5.  We discussed options including living with her symptoms versus surgical options.  She has absolutely no back pain. We discussed decompression versus decompression and fusion. Also a option for a Coflex.  I reviewed and discussed her case in detail with Dr. Shon Baton. Review an x-ray MRI and options.  Given no back pain there is a option for a straight forward decompression. Unfortunately the a Coflex has been traditionally denied by her insurance carrier. We discussed that and I investigated that.  I had an extensive discussion with the patient concerning the pathology relevant anatomy and treatment options. At this point exhausting conservative treatment and in the presence of a neurologic deficit we discussed microlumbar decompression. I discussed the risks and benefits including bleeding, infection, DVT, PE, anesthetic complications, worsening in their symptoms, improvement in their symptoms, C SF leakage, epidural fibrosis, need for future surgeries such as revision discectomy and lumbar fusion. I also indicated that this is an operation to basically decompress the nerve root to allow  recovery. And that given no back pain primary decompression will be performed. In the future however she may very well require additional surgery that would include a instrumented fusion.  She is aware of that.  I discussed the operative course including overnight in the hospital. Immediate ambulation. Follow-up in 2 weeks for suture removal. 6 weeks until healing of the herniation followed by 6 weeks of reconditioning and strengthening of the core musculature. Also discussed the need to employ the concepts of disc pressure management and core motion following the surgery to minimize the risk of recurrent disc herniation. We will obtain preoperative clearance i if necessary and proceed accordingly.  In the interim I gave prescription for Percocet to be taken up for pain.  Plan microlumbar decompression L4-5  Dorothy Spark., PA-C for Dr. Shelle Iron 12/07/2017, 8:32 AM

## 2017-12-07 NOTE — H&P (View-Only) (Signed)
[name] is an 46 y.o. female.   Chief Complaint: back and leg pain HPI: Reported by patient. Reason for Visit: Diagnositc Results (lumbar MRI)  Context: The patient is 10 weeks out from when symptoms began.  Location (Lower Extremity): leg pain bilateral, ,  Severity: pain level 7/10  Timing: constant  Alleviating Factors: changing positions  Associated Symptoms: numbness/tingling (right lower extremity)  Medications: The patient is taking Gabapentin and Vimovo.  Past Medical Hx HTN  Past surgical hx ORIF - left wrist - then removal of hardwre Splenectomy Adenoid Surgery Reduction mammoplasty Appendectomy  Social Hx Smoking Status: Never smoker Occupation: Special education teachers Alcohol intake: Occasional Hand Dominance: Right Work related injury?: N Advance directive: N  Family Hx Father - Hypertensive disorder Mother - Diabetes mellitus  Allergies:  Allergies  Allergen Reactions  . Penicillins Rash    Rash as a child. No swelling or anaphylactic reaction.   Medications fluticasone propionate 50 mcg/actuation nasal spray,suspension gabapentin 300 mg capsule lisinopril 5 mg tablet NIFEdipine ER 60 mg tablet,extended release 24 hr oxyCODONE-acetaminophen 5 mg-325 mg tablet Vimovo 500 mg-20 mg tablet,immediate and delay release  Review of Systems  Constitutional: Negative.   HENT: Negative.   Eyes: Negative.   Respiratory: Negative.   Cardiovascular: Negative.   Gastrointestinal: Negative.   Genitourinary: Negative.   Musculoskeletal: Positive for back pain.  Skin: Negative.   Neurological: Positive for sensory change and focal weakness.  Psychiatric/Behavioral: Negative.     There were no vitals taken for this visit. Physical Exam  Constitutional: She is oriented to person, place, and time. She appears well-developed and well-nourished.  HENT:  Head: Normocephalic.  Eyes: Pupils are equal, round, and reactive to light.  Neck: Normal range  of motion.  Cardiovascular: Normal rate.  Respiratory: Effort normal.  GI: Soft.  Musculoskeletal:  Patient is a 46-year-old female.  Gait and Station: Appearance: ambulating with no assistive devices and antalgic gait.  Constitutional: General Appearance: healthy-appearing and distress (mild).  Psychiatric: Mood and Affect: active and alert.  Cardiovascular System: Edema Right: none; Dorsalis and posterior tibial pulses 2+. Edema Left: none.  Abdomen: Inspection and Palpation: non-distended and no tenderness.  Skin: Inspection and palpation: no rash.  Lumbar Spine: Inspection: normal alignment. Bony Palpation of the Lumbar Spine: tender at lumbosacral junction.. Bony Palpation of the Right Hip: no tenderness of the greater trochanter and tenderness of the SI joint; Pelvis stable. Bony Palpation of the Left Hip: no tenderness of the greater trochanter and tenderness of the SI joint. Soft Tissue Palpation on the Right: No flank pain with percussion. Active Range of Motion: limited flexion and extention.  Motor Strength: L1 Motor Strength on the Right: hip flexion iliopsoas 5/5. L1 Motor Strength on the Left: hip flexion iliopsoas 5/5. L2-L4 Motor Strength on the Right: knee extension quadriceps 5/5. L2-L4 Motor Strength on the Left: knee extension quadriceps 5/5. L5 Motor Strength on the Right: ankle dorsiflexion tibialis anterior 5/5 and great toe extension extensor hallucis longus 5/5. L5 Motor Strength on the Left: ankle dorsiflexion tibialis anterior 5/5 and great toe extension extensor hallucis longus 5/5. S1 Motor Strength on the Right: plantar flexion gastrocnemius 5/5. S1 Motor Strength on the Left: plantar flexion gastrocnemius 5/5.  Neurological System: Knee Reflex Right: normal (2). Knee Reflex Left: normal (2). Ankle Reflex Right: normal (2). Ankle Reflex Left: normal (2). Babinski Reflex Right: plantar reflex absent. Babinski Reflex Left: plantar reflex absent. Sensation on the  Right: normal distal extremities. Sensation on the   Left: normal distal extremities. Special Tests on the Right: no clonus of the ankle/knee and seated straight leg raising test positive. Special Tests on the Left: no clonus of the ankle/knee and seated straight leg raising test positive.  Trace EHL weakness is noted bilaterally. Normal cervical lordosis. No pain with range of motion. No palpable tenderness. Motor is 5/5 in all groups in the upper extremities. Upper extremity sensory exam normal. Patient is normoreflexic in the upper extremities. No Hoffmann sign.  Neurological: She is alert and oriented to person, place, and time.  Skin: Skin is warm and dry.     Assessment/Plan Spinal stenosis  Patient demonstrates persistent bilateral L5 radiculopathy secondary to multifactorial spinal stenosis at L4-5.  We discussed options including living with her symptoms versus surgical options.  She has absolutely no back pain. We discussed decompression versus decompression and fusion. Also a option for a Coflex.  I reviewed and discussed her case in detail with Dr. Brooks. Review an x-ray MRI and options.  Given no back pain there is a option for a straight forward decompression. Unfortunately the a Coflex has been traditionally denied by her insurance carrier. We discussed that and I investigated that.  I had an extensive discussion with the patient concerning the pathology relevant anatomy and treatment options. At this point exhausting conservative treatment and in the presence of a neurologic deficit we discussed microlumbar decompression. I discussed the risks and benefits including bleeding, infection, DVT, PE, anesthetic complications, worsening in their symptoms, improvement in their symptoms, C SF leakage, epidural fibrosis, need for future surgeries such as revision discectomy and lumbar fusion. I also indicated that this is an operation to basically decompress the nerve root to allow  recovery. And that given no back pain primary decompression will be performed. In the future however she may very well require additional surgery that would include a instrumented fusion.  She is aware of that.  I discussed the operative course including overnight in the hospital. Immediate ambulation. Follow-up in 2 weeks for suture removal. 6 weeks until healing of the herniation followed by 6 weeks of reconditioning and strengthening of the core musculature. Also discussed the need to employ the concepts of disc pressure management and core motion following the surgery to minimize the risk of recurrent disc herniation. We will obtain preoperative clearance i if necessary and proceed accordingly.  In the interim I gave prescription for Percocet to be taken up for pain.  Plan microlumbar decompression L4-5  Vernor Monnig M., PA-C for Dr. Beane 12/07/2017, 8:32 AM   

## 2017-12-09 NOTE — Pre-Procedure Instructions (Signed)
Thurmon Fairara D Berkowitz  12/09/2017      [id number] - [place], [place] - [address] [place] [id number] Phone: [phone] Fax: [phone]    Your procedure is scheduled on July 25  [facility] at 0800 A.M.  Call this number if you have problems the morning of surgery:  [phone]   Remember:  Do not eat or drink after midnight.    Take these medicines the morning of surgery with A SIP OF WATER  gabapentin (NEURONTIN)   7 days prior to surgery STOP taking any Aspirin(unless otherwise instructed by your surgeon), Aleve, Naproxen, Ibuprofen, Motrin, Advil, Goody's, BC's, all herbal medications, fish oil, and all vitamins     Do not wear jewelry, make-up or nail polish.  Do not wear lotions, powders, or perfumes, or deodorant.  Do not shave 48 hours prior to surgery.   Do not bring valuables to the hospital.  [facility] is not responsible for any belongings or valuables.  Contacts, dentures or bridgework may not be worn into surgery.  Leave your suitcase in the car.  After surgery it may be brought to your room.  For patients admitted to the hospital, discharge time will be determined by your treatment team.  Patients discharged the day of surgery will not be allowed to drive home.    Special instructions:   [facility]- Preparing For Surgery  Before surgery, you can play an important role. Because skin is not sterile, your skin needs to be as free of germs as possible. You can reduce the number of germs on your skin by washing with CHG (chlorahexidine gluconate) Soap before surgery.  CHG is an antiseptic cleaner which kills germs and bonds with the skin to continue killing germs even after washing.    Oral Hygiene is also important to reduce your risk of infection.  Remember - BRUSH YOUR TEETH THE MORNING OF SURGERY WITH YOUR REGULAR TOOTHPASTE  Please do not use if you have an allergy to CHG  or antibacterial soaps. If your skin becomes reddened/irritated stop using the CHG.  Do not shave (including legs and underarms) for at least 48 hours prior to first CHG shower. It is OK to shave your face.  Please follow these instructions carefully.   1. Shower the NIGHT BEFORE SURGERY and the MORNING OF SURGERY with CHG.   2. If you chose to wash your hair, wash your hair first as usual with your normal shampoo.  3. After you shampoo, rinse your hair and body thoroughly to remove the shampoo.  4. Use CHG as you would any other liquid soap. You can apply CHG directly to the skin and wash gently with a scrungie or a clean washcloth.   5. Apply the CHG Soap to your body ONLY FROM THE NECK DOWN.  Do not use on open wounds or open sores. Avoid contact with your eyes, ears, mouth and genitals (private parts). Wash Face and genitals (private parts)  with your normal soap.  6. Wash thoroughly, paying special attention to the area where your surgery will be performed.  7. Thoroughly rinse your body with warm water from the neck down.  8. DO NOT shower/wash with your normal soap after using and rinsing off the CHG Soap.  9. Pat yourself dry with a CLEAN TOWEL.  10. Wear CLEAN PAJAMAS to bed the night before surgery, wear comfortable clothes the morning  of surgery  11. Place CLEAN SHEETS on your bed the night of your first shower and DO NOT SLEEP WITH PETS.    Day of Surgery:  Do not apply any deodorants/lotions.  Please wear clean clothes to the hospital/surgery center.   Remember to brush your teeth WITH YOUR REGULAR TOOTHPASTE.    Please read over the following fact sheets that you were given.

## 2017-12-10 ENCOUNTER — Encounter (HOSPITAL_COMMUNITY)
Admission: RE | Admit: 2017-12-10 | Discharge: 2017-12-10 | Disposition: A | Discharge status: Home / Self Care | Payer: BC Managed Care – PPO | Source: Ambulatory Visit | Attending: Specialist | Admitting: Specialist

## 2017-12-10 ENCOUNTER — Ambulatory Visit (HOSPITAL_COMMUNITY)
Admission: RE | Admit: 2017-12-10 | Discharge: 2017-12-10 | Disposition: A | Discharge status: Home / Self Care | Payer: BC Managed Care – PPO | Source: Ambulatory Visit | Attending: Orthopedic Surgery | Admitting: Orthopedic Surgery

## 2017-12-10 ENCOUNTER — Other Ambulatory Visit: Payer: Self-pay

## 2017-12-10 ENCOUNTER — Encounter (HOSPITAL_COMMUNITY): Payer: Self-pay

## 2017-12-10 DIAGNOSIS — M48061 Spinal stenosis, lumbar region without neurogenic claudication: Secondary | ICD-10-CM | POA: Diagnosis present

## 2017-12-10 DIAGNOSIS — M4316 Spondylolisthesis, lumbar region: Secondary | ICD-10-CM | POA: Insufficient documentation

## 2017-12-10 DIAGNOSIS — Z01810 Encounter for preprocedural cardiovascular examination: Secondary | ICD-10-CM | POA: Diagnosis present

## 2017-12-10 HISTORY — DX: Unspecified osteoarthritis, unspecified site: M19.90

## 2017-12-10 HISTORY — DX: Myoneural disorder, unspecified: G70.9

## 2017-12-10 HISTORY — DX: Essential (primary) hypertension: I10

## 2017-12-10 LAB — CBC
HEMATOCRIT: 40.4 % (ref 36.0–46.0)
Hemoglobin: 13.4 g/dL (ref 12.0–15.0)
MCH: 27.8 pg (ref 26.0–34.0)
MCHC: 33.2 g/dL (ref 30.0–36.0)
MCV: 83.8 fL (ref 78.0–100.0)
PLATELETS: 333 10*3/uL (ref 150–400)
RBC: 4.82 MIL/uL (ref 3.87–5.11)
RDW: 12.5 % (ref 11.5–15.5)
WBC: 8.3 10*3/uL (ref 4.0–10.5)

## 2017-12-10 LAB — BASIC METABOLIC PANEL
Anion gap: 9 (ref 5–15)
BUN: 14 mg/dL (ref 6–20)
CO2: 21 mmol/L — ABNORMAL LOW (ref 22–32)
CREATININE: 0.91 mg/dL (ref 0.44–1.00)
Calcium: 9.5 mg/dL (ref 8.9–10.3)
Chloride: 108 mmol/L (ref 98–111)
GFR calc Af Amer: >60 mL/min (ref >60)
GLUCOSE: 113 mg/dL — AB (ref 70–99)
Potassium: 3.7 mmol/L (ref 3.5–5.1)
Sodium: 138 mmol/L (ref 135–145)

## 2017-12-10 LAB — SURGICAL PCR SCREEN
MRSA, PCR: NEGATIVE
Staphylococcus aureus: NEGATIVE

## 2017-12-10 NOTE — Progress Notes (Signed)
PCP: [facility]  Cardiologist: pt denies  EKG: pt denies past year, obtained today  Stress test: pt denies  ECHO: pt denies  Cardiac Cath: pt denies  Chest x-ray: pt denies past year, no recent respiratory complications/infections

## 2017-12-15 NOTE — Anesthesia Preprocedure Evaluation (Addendum)
Anesthesia Evaluation  Patient identified by MRN, date of birth, ID band Patient awake    Reviewed: Allergy & Precautions, H&P , NPO status , Patient's Chart, lab work & pertinent test results, reviewed documented beta blocker date and time   Airway Mallampati: II  TM Distance: >3 FB Neck ROM: full    Dental no notable dental hx. (+) Teeth Intact, Dental Advidsory Given   Pulmonary    Pulmonary exam normal breath sounds clear to auscultation       Cardiovascular Exercise Tolerance: Good hypertension, Pt. on medications  Rhythm:regular Rate:Normal     Neuro/Psych  Neuromuscular disease    GI/Hepatic   Endo/Other    Renal/GU      Musculoskeletal  (+) Arthritis , Osteoarthritis,    Abdominal   Peds  Hematology   Anesthesia Other Findings   Reproductive/Obstetrics                            Anesthesia Physical Anesthesia Plan  ASA: II  Anesthesia Plan: General   Post-op Pain Management:    Induction:   PONV Risk Score and Plan: 3 and Ondansetron, Dexamethasone and Treatment may vary due to age or medical condition  Airway Management Planned: Oral ETT  Additional Equipment:   Intra-op Plan:   Post-operative Plan:   Informed Consent: I have reviewed the patients History and Physical, chart, labs and discussed the procedure including the risks, benefits and alternatives for the proposed anesthesia with the patient or authorized representative who has indicated his/her understanding and acceptance.   Dental Advisory Given  Plan Discussed with: CRNA, Anesthesiologist and Surgeon  Anesthesia Plan Comments:         Anesthesia Quick Evaluation

## 2017-12-16 ENCOUNTER — Ambulatory Visit (HOSPITAL_COMMUNITY): Payer: BC Managed Care – PPO | Admitting: Anesthesiology

## 2017-12-16 ENCOUNTER — Ambulatory Visit (HOSPITAL_COMMUNITY)
Admission: RE | Admit: 2017-12-16 | Discharge: 2017-12-17 | Disposition: A | Discharge status: Home / Self Care | Payer: BC Managed Care – PPO | Source: Ambulatory Visit | Attending: Specialist | Admitting: Specialist

## 2017-12-16 ENCOUNTER — Ambulatory Visit (HOSPITAL_COMMUNITY)
Admission: RE | Disposition: A | Discharge status: Home / Self Care | Payer: Self-pay | Source: Ambulatory Visit | Attending: Specialist

## 2017-12-16 ENCOUNTER — Ambulatory Visit (HOSPITAL_COMMUNITY): Payer: BC Managed Care – PPO

## 2017-12-16 ENCOUNTER — Encounter (HOSPITAL_COMMUNITY): Payer: Self-pay | Admitting: *Deleted

## 2017-12-16 ENCOUNTER — Other Ambulatory Visit: Payer: Self-pay

## 2017-12-16 DIAGNOSIS — Z880 Allergy status to penicillin: Secondary | ICD-10-CM | POA: Diagnosis not present

## 2017-12-16 DIAGNOSIS — M48061 Spinal stenosis, lumbar region without neurogenic claudication: Secondary | ICD-10-CM | POA: Diagnosis present

## 2017-12-16 DIAGNOSIS — Z79899 Other long term (current) drug therapy: Secondary | ICD-10-CM | POA: Diagnosis not present

## 2017-12-16 DIAGNOSIS — I10 Essential (primary) hypertension: Secondary | ICD-10-CM | POA: Insufficient documentation

## 2017-12-16 DIAGNOSIS — Z419 Encounter for procedure for purposes other than remedying health state, unspecified: Secondary | ICD-10-CM

## 2017-12-16 HISTORY — PX: LUMBAR LAMINECTOMY/DECOMPRESSION MICRODISCECTOMY: SHX5026

## 2017-12-16 SURGERY — LUMBAR LAMINECTOMY/DECOMPRESSION MICRODISCECTOMY 1 LEVEL
Anesthesia: General | Site: Spine Lumbar

## 2017-12-16 MED ORDER — ACETAMINOPHEN 325 MG PO TABS: 325.0000 mg | ORAL_TABLET | ORAL | Status: DC | PRN

## 2017-12-16 MED ORDER — SODIUM CHLORIDE 0.9 % IV SOLN
INTRAVENOUS | Status: DC | PRN
  Administered 2017-12-16: 500 mL

## 2017-12-16 MED ORDER — ONDANSETRON HCL 4 MG/2ML IJ SOLN: 4.0000 mg | Freq: Once | INTRAMUSCULAR | Status: DC | PRN

## 2017-12-16 MED ORDER — METHOCARBAMOL 500 MG PO TABS
500.0000 mg | ORAL_TABLET | Freq: Four times a day (QID) | ORAL | Status: DC | PRN
  Administered 2017-12-16 – 2017-12-17 (×4): 500 mg via ORAL
  Filled 2017-12-16 (×3): qty 1

## 2017-12-16 MED ORDER — ROCURONIUM BROMIDE 10 MG/ML (PF) SYRINGE
PREFILLED_SYRINGE | INTRAVENOUS | Status: AC
  Filled 2017-12-16: qty 20

## 2017-12-16 MED ORDER — NIFEDIPINE ER OSMOTIC RELEASE 60 MG PO TB24
60.0000 mg | ORAL_TABLET | Freq: Every day | ORAL | Status: DC
  Administered 2017-12-17: 60 mg via ORAL
  Filled 2017-12-16: qty 1

## 2017-12-16 MED ORDER — METHOCARBAMOL 500 MG PO TABS: 500.0000 mg | ORAL_TABLET | Freq: Four times a day (QID) | ORAL | 1 refills | Status: AC | PRN

## 2017-12-16 MED ORDER — ALUM & MAG HYDROXIDE-SIMETH 200-200-20 MG/5ML PO SUSP: 30.0000 mL | Freq: Four times a day (QID) | ORAL | Status: DC | PRN

## 2017-12-16 MED ORDER — ROCURONIUM BROMIDE 100 MG/10ML IV SOLN
INTRAVENOUS | Status: DC | PRN
  Administered 2017-12-16: 50 mg via INTRAVENOUS

## 2017-12-16 MED ORDER — SODIUM CHLORIDE 0.9% FLUSH: 3.0000 mL | Freq: Two times a day (BID) | INTRAVENOUS | Status: DC

## 2017-12-16 MED ORDER — THROMBIN 20000 UNITS EX SOLR
CUTANEOUS | Status: DC | PRN
  Administered 2017-12-16: 20 mL via TOPICAL

## 2017-12-16 MED ORDER — PROPOFOL 10 MG/ML IV BOLUS
INTRAVENOUS | Status: DC | PRN
  Administered 2017-12-16: 150 mg via INTRAVENOUS

## 2017-12-16 MED ORDER — LIDOCAINE 2% (20 MG/ML) 5 ML SYRINGE
INTRAMUSCULAR | Status: AC
  Filled 2017-12-16: qty 10

## 2017-12-16 MED ORDER — SODIUM CHLORIDE 0.9 % IV SOLN
INTRAVENOUS | Status: DC | PRN
  Administered 2017-12-16: 50 ug/min via INTRAVENOUS
  Administered 2017-12-16: 25 ug/min via INTRAVENOUS

## 2017-12-16 MED ORDER — CEFAZOLIN SODIUM-DEXTROSE 2-4 GM/100ML-% IV SOLN
INTRAVENOUS | Status: AC
Start: 2017-12-16 — End: 2017-12-16
  Filled 2017-12-16: qty 100

## 2017-12-16 MED ORDER — ASPIRIN EC 81 MG PO TBEC: 81.0000 mg | DELAYED_RELEASE_TABLET | Freq: Every day | ORAL | Status: AC

## 2017-12-16 MED ORDER — BUPIVACAINE-EPINEPHRINE 0.5% -1:200000 IJ SOLN
INTRAMUSCULAR | Status: DC | PRN
  Administered 2017-12-16: 13 mL

## 2017-12-16 MED ORDER — OXYCODONE HCL 5 MG PO TABS
ORAL_TABLET | ORAL | Status: AC
  Filled 2017-12-16: qty 1

## 2017-12-16 MED ORDER — PHENYLEPHRINE 40 MCG/ML (10ML) SYRINGE FOR IV PUSH (FOR BLOOD PRESSURE SUPPORT)
PREFILLED_SYRINGE | INTRAVENOUS | Status: AC
  Filled 2017-12-16: qty 10

## 2017-12-16 MED ORDER — 0.9 % SODIUM CHLORIDE (POUR BTL) OPTIME
TOPICAL | Status: DC | PRN
  Administered 2017-12-16: 1000 mL

## 2017-12-16 MED ORDER — FENTANYL CITRATE (PF) 250 MCG/5ML IJ SOLN
INTRAMUSCULAR | Status: AC
  Filled 2017-12-16: qty 5

## 2017-12-16 MED ORDER — PHENOL 1.4 % MT LIQD: 1.0000 | OROMUCOSAL | Status: DC | PRN

## 2017-12-16 MED ORDER — DEXAMETHASONE SODIUM PHOSPHATE 10 MG/ML IJ SOLN
INTRAMUSCULAR | Status: AC
  Filled 2017-12-16: qty 2

## 2017-12-16 MED ORDER — ONDANSETRON HCL 4 MG/2ML IJ SOLN: 4.0000 mg | Freq: Four times a day (QID) | INTRAMUSCULAR | Status: DC | PRN

## 2017-12-16 MED ORDER — SODIUM CHLORIDE 0.9% FLUSH: 3.0000 mL | INTRAVENOUS | Status: DC | PRN

## 2017-12-16 MED ORDER — HYDROMORPHONE HCL 1 MG/ML IJ SOLN: 0.5000 mg | INTRAMUSCULAR | Status: DC | PRN

## 2017-12-16 MED ORDER — ZOLPIDEM TARTRATE 5 MG PO TABS
5.0000 mg | ORAL_TABLET | Freq: Every evening | ORAL | Status: DC | PRN
Start: 2017-12-16 — End: 2017-12-17

## 2017-12-16 MED ORDER — ONDANSETRON HCL 4 MG/2ML IJ SOLN
INTRAMUSCULAR | Status: DC | PRN
  Administered 2017-12-16: 4 mg via INTRAVENOUS

## 2017-12-16 MED ORDER — MEPERIDINE HCL 50 MG/ML IJ SOLN: 6.2500 mg | INTRAMUSCULAR | Status: DC | PRN

## 2017-12-16 MED ORDER — SODIUM CHLORIDE 0.9 % IJ SOLN
INTRAMUSCULAR | Status: AC
  Filled 2017-12-16: qty 20

## 2017-12-16 MED ORDER — CEFAZOLIN SODIUM-DEXTROSE 2-4 GM/100ML-% IV SOLN
2.0000 g | INTRAVENOUS | Status: AC
  Administered 2017-12-16: 2 g via INTRAVENOUS

## 2017-12-16 MED ORDER — ACETAMINOPHEN 160 MG/5ML PO SOLN: 325.0000 mg | ORAL | Status: DC | PRN

## 2017-12-16 MED ORDER — FENTANYL CITRATE (PF) 100 MCG/2ML IJ SOLN
INTRAMUSCULAR | Status: AC
  Filled 2017-12-16: qty 2

## 2017-12-16 MED ORDER — LACTATED RINGERS IV SOLN
INTRAVENOUS | Status: DC | PRN
  Administered 2017-12-16 (×2): via INTRAVENOUS

## 2017-12-16 MED ORDER — ACETAMINOPHEN 10 MG/ML IV SOLN
1000.0000 mg | INTRAVENOUS | Status: AC
  Administered 2017-12-16: 1000 mg via INTRAVENOUS

## 2017-12-16 MED ORDER — OXYCODONE HCL 5 MG PO TABS
5.0000 mg | ORAL_TABLET | Freq: Once | ORAL | Status: AC | PRN
  Administered 2017-12-16: 5 mg via ORAL

## 2017-12-16 MED ORDER — ACETAMINOPHEN 650 MG RE SUPP: 650.0000 mg | RECTAL | Status: DC | PRN

## 2017-12-16 MED ORDER — DEXTROSE-NACL 5-0.45 % IV SOLN
INTRAVENOUS | Status: DC
  Administered 2017-12-16: 18:00:00 via INTRAVENOUS

## 2017-12-16 MED ORDER — MIDAZOLAM HCL 2 MG/2ML IJ SOLN
INTRAMUSCULAR | Status: AC
  Filled 2017-12-16: qty 2

## 2017-12-16 MED ORDER — PROPOFOL 10 MG/ML IV BOLUS
INTRAVENOUS | Status: AC
  Filled 2017-12-16: qty 20

## 2017-12-16 MED ORDER — OXYCODONE HCL 5 MG PO TABS
10.0000 mg | ORAL_TABLET | ORAL | Status: DC | PRN
  Administered 2017-12-16 – 2017-12-17 (×6): 10 mg via ORAL
  Filled 2017-12-16 (×6): qty 2

## 2017-12-16 MED ORDER — CEFAZOLIN SODIUM-DEXTROSE 2-4 GM/100ML-% IV SOLN
2.0000 g | Freq: Three times a day (TID) | INTRAVENOUS | Status: AC
  Administered 2017-12-16 – 2017-12-17 (×2): 2 g via INTRAVENOUS
  Filled 2017-12-16 (×2): qty 100

## 2017-12-16 MED ORDER — OXYCODONE HCL 5 MG PO TABS: 5.0000 mg | ORAL_TABLET | ORAL | 0 refills | Status: AC | PRN

## 2017-12-16 MED ORDER — FENTANYL CITRATE (PF) 100 MCG/2ML IJ SOLN
25.0000 ug | INTRAMUSCULAR | Status: DC | PRN
  Administered 2017-12-16 (×4): 25 ug via INTRAVENOUS

## 2017-12-16 MED ORDER — ACETAMINOPHEN 325 MG PO TABS: 650.0000 mg | ORAL_TABLET | ORAL | Status: DC | PRN

## 2017-12-16 MED ORDER — MENTHOL 3 MG MT LOZG: 1.0000 | LOZENGE | OROMUCOSAL | Status: DC | PRN

## 2017-12-16 MED ORDER — POLYETHYLENE GLYCOL 3350 17 G PO PACK: 17.0000 g | PACK | Freq: Every day | ORAL | 0 refills | Status: AC

## 2017-12-16 MED ORDER — ONDANSETRON HCL 4 MG PO TABS: 4.0000 mg | ORAL_TABLET | Freq: Four times a day (QID) | ORAL | Status: DC | PRN

## 2017-12-16 MED ORDER — MAGNESIUM CITRATE PO SOLN: 1.0000 | Freq: Once | ORAL | Status: DC | PRN

## 2017-12-16 MED ORDER — DOCUSATE SODIUM 100 MG PO CAPS: 100.0000 mg | ORAL_CAPSULE | Freq: Two times a day (BID) | ORAL | 2 refills | Status: AC

## 2017-12-16 MED ORDER — OXYCODONE HCL 5 MG/5ML PO SOLN: 5.0000 mg | Freq: Once | ORAL | Status: AC | PRN

## 2017-12-16 MED ORDER — RISAQUAD PO CAPS
1.0000 | ORAL_CAPSULE | Freq: Every day | ORAL | Status: DC
  Filled 2017-12-16 (×2): qty 1

## 2017-12-16 MED ORDER — OXYCODONE HCL 5 MG PO TABS: 5.0000 mg | ORAL_TABLET | ORAL | Status: DC | PRN

## 2017-12-16 MED ORDER — SODIUM CHLORIDE 0.9 % IV SOLN: 250.0000 mL | INTRAVENOUS | Status: DC

## 2017-12-16 MED ORDER — METHOCARBAMOL 500 MG PO TABS
ORAL_TABLET | ORAL | Status: AC
  Filled 2017-12-16: qty 1

## 2017-12-16 MED ORDER — DEXAMETHASONE SODIUM PHOSPHATE 10 MG/ML IJ SOLN
INTRAMUSCULAR | Status: DC | PRN
  Administered 2017-12-16: 10 mg via INTRAVENOUS

## 2017-12-16 MED ORDER — THROMBIN (RECOMBINANT) 20000 UNITS EX SOLR
CUTANEOUS | Status: AC
  Filled 2017-12-16: qty 20000

## 2017-12-16 MED ORDER — EPHEDRINE SULFATE 50 MG/ML IJ SOLN
INTRAMUSCULAR | Status: AC
  Filled 2017-12-16: qty 2

## 2017-12-16 MED ORDER — BISACODYL 5 MG PO TBEC: 5.0000 mg | DELAYED_RELEASE_TABLET | Freq: Every day | ORAL | Status: DC | PRN

## 2017-12-16 MED ORDER — GABAPENTIN 300 MG PO CAPS
600.0000 mg | ORAL_CAPSULE | Freq: Three times a day (TID) | ORAL | Status: DC
  Administered 2017-12-16 – 2017-12-17 (×3): 600 mg via ORAL
  Filled 2017-12-16 (×3): qty 2

## 2017-12-16 MED ORDER — SUGAMMADEX SODIUM 500 MG/5ML IV SOLN
INTRAVENOUS | Status: AC
  Filled 2017-12-16: qty 5

## 2017-12-16 MED ORDER — MIDAZOLAM HCL 5 MG/5ML IJ SOLN
INTRAMUSCULAR | Status: DC | PRN
  Administered 2017-12-16: 2 mg via INTRAVENOUS

## 2017-12-16 MED ORDER — ACETAMINOPHEN 10 MG/ML IV SOLN
INTRAVENOUS | Status: AC
  Filled 2017-12-16: qty 100

## 2017-12-16 MED ORDER — LIDOCAINE HCL (CARDIAC) PF 100 MG/5ML IV SOSY
PREFILLED_SYRINGE | INTRAVENOUS | Status: DC | PRN
  Administered 2017-12-16: 60 mg via INTRAVENOUS

## 2017-12-16 MED ORDER — LACTATED RINGERS IV SOLN: INTRAVENOUS | Status: DC

## 2017-12-16 MED ORDER — LISINOPRIL 10 MG PO TABS
5.0000 mg | ORAL_TABLET | Freq: Every day | ORAL | Status: DC
  Administered 2017-12-17: 5 mg via ORAL
  Filled 2017-12-16: qty 1

## 2017-12-16 MED ORDER — METHOCARBAMOL 1000 MG/10ML IJ SOLN
500.0000 mg | Freq: Four times a day (QID) | INTRAVENOUS | Status: DC | PRN
  Filled 2017-12-16: qty 5

## 2017-12-16 MED ORDER — FENTANYL CITRATE (PF) 100 MCG/2ML IJ SOLN
INTRAMUSCULAR | Status: DC | PRN
  Administered 2017-12-16: 100 ug via INTRAVENOUS
  Administered 2017-12-16 (×2): 25 ug via INTRAVENOUS

## 2017-12-16 MED ORDER — LORATADINE 10 MG PO TABS
10.0000 mg | ORAL_TABLET | Freq: Every day | ORAL | Status: DC
  Administered 2017-12-16 – 2017-12-17 (×2): 10 mg via ORAL
  Filled 2017-12-16 (×2): qty 1

## 2017-12-16 MED ORDER — ONDANSETRON HCL 4 MG/2ML IJ SOLN
INTRAMUSCULAR | Status: AC
  Filled 2017-12-16: qty 4

## 2017-12-16 MED ORDER — POLYETHYLENE GLYCOL 3350 17 G PO PACK: 17.0000 g | PACK | Freq: Every day | ORAL | Status: DC | PRN

## 2017-12-16 MED ORDER — DOCUSATE SODIUM 100 MG PO CAPS
100.0000 mg | ORAL_CAPSULE | Freq: Two times a day (BID) | ORAL | Status: DC
  Administered 2017-12-16 – 2017-12-17 (×2): 100 mg via ORAL
  Filled 2017-12-16 (×2): qty 1

## 2017-12-16 MED ORDER — PHENYLEPHRINE HCL 10 MG/ML IJ SOLN
INTRAMUSCULAR | Status: AC
  Filled 2017-12-16: qty 1

## 2017-12-16 SURGICAL SUPPLY — 65 items
BAG DECANTER FOR FLEXI CONT (MISCELLANEOUS) ×3 IMPLANT
CLEANER TIP ELECTROSURG 2X2 (MISCELLANEOUS) ×3 IMPLANT
CLOSURE STERI-STRIP 1/2X4 (GAUZE/BANDAGES/DRESSINGS) ×1
CLOSURE WOUND 1/2 X4 (GAUZE/BANDAGES/DRESSINGS)
CLOTH 2% CHLOROHEXIDINE 3PK (PERSONAL CARE ITEMS) ×3 IMPLANT
CLSR STERI-STRIP ANTIMIC 1/2X4 (GAUZE/BANDAGES/DRESSINGS) ×1 IMPLANT
CONT SPEC 4OZ CLIKSEAL STRL BL (MISCELLANEOUS) ×5 IMPLANT
DRAPE HALF SHEET 40X57 (DRAPES) ×2 IMPLANT
DRAPE LAPAROTOMY 100X72X124 (DRAPES) ×3 IMPLANT
DRAPE MICROSCOPE LEICA (MISCELLANEOUS) ×3 IMPLANT
DRAPE SHEET LG 3/4 BI-LAMINATE (DRAPES) ×1 IMPLANT
DRAPE SURG 17X11 SM STRL (DRAPES) ×3 IMPLANT
DRAPE UTILITY XL STRL (DRAPES) ×3 IMPLANT
DRSG AQUACEL AG ADV 3.5X 4 (GAUZE/BANDAGES/DRESSINGS) IMPLANT
DRSG AQUACEL AG ADV 3.5X 6 (GAUZE/BANDAGES/DRESSINGS) IMPLANT
DRSG OPSITE POSTOP 4X6 (GAUZE/BANDAGES/DRESSINGS) ×2 IMPLANT
DRSG TELFA 3X8 NADH (GAUZE/BANDAGES/DRESSINGS) IMPLANT
DURAPREP 26ML APPLICATOR (WOUND CARE) ×3 IMPLANT
DURASEAL SPINE SEALANT 3ML (MISCELLANEOUS) IMPLANT
ELECT BLADE 4.0 EZ CLEAN MEGAD (MISCELLANEOUS) ×3
ELECT REM PT RETURN 9FT ADLT (ELECTROSURGICAL) ×3
ELECTRODE BLDE 4.0 EZ CLN MEGD (MISCELLANEOUS) IMPLANT
ELECTRODE REM PT RTRN 9FT ADLT (ELECTROSURGICAL) ×1 IMPLANT
GLOVE BIOGEL PI IND STRL 6.5 (GLOVE) IMPLANT
GLOVE BIOGEL PI IND STRL 7.0 (GLOVE) ×1 IMPLANT
GLOVE BIOGEL PI IND STRL 7.5 (GLOVE) IMPLANT
GLOVE BIOGEL PI INDICATOR 6.5 (GLOVE) ×2
GLOVE BIOGEL PI INDICATOR 7.0 (GLOVE) ×8
GLOVE BIOGEL PI INDICATOR 7.5 (GLOVE) ×8
GLOVE SS N UNI LF 6.5 STRL (GLOVE) ×2 IMPLANT
GLOVE SURG SS PI 7.5 STRL IVOR (GLOVE) ×3 IMPLANT
GLOVE SURG SS PI 8.0 STRL IVOR (GLOVE) ×6 IMPLANT
GOWN STRL REUS W/ TWL LRG LVL3 (GOWN DISPOSABLE) ×1 IMPLANT
GOWN STRL REUS W/ TWL XL LVL3 (GOWN DISPOSABLE) ×1 IMPLANT
GOWN STRL REUS W/TWL LRG LVL3 (GOWN DISPOSABLE) ×6
GOWN STRL REUS W/TWL XL LVL3 (GOWN DISPOSABLE) ×6
IV CATH 14GX2 1/4 (CATHETERS) ×3 IMPLANT
KIT BASIN OR (CUSTOM PROCEDURE TRAY) ×3 IMPLANT
KIT POSITION SURG JACKSON T1 (MISCELLANEOUS) ×3 IMPLANT
NDL SPNL 18GX3.5 QUINCKE PK (NEEDLE) ×2 IMPLANT
NEEDLE 22X1 1/2 (OR ONLY) (NEEDLE) ×3 IMPLANT
NEEDLE SPNL 18GX3.5 QUINCKE PK (NEEDLE) ×6 IMPLANT
PACK LAMINECTOMY NEURO (CUSTOM PROCEDURE TRAY) ×3 IMPLANT
PAD DRESSING TELFA 3X8 NADH (GAUZE/BANDAGES/DRESSINGS) IMPLANT
PATTIES SURGICAL .5 X.5 (GAUZE/BANDAGES/DRESSINGS) ×2 IMPLANT
PATTIES SURGICAL .75X.75 (GAUZE/BANDAGES/DRESSINGS) ×3 IMPLANT
RUBBERBAND STERILE (MISCELLANEOUS) ×6 IMPLANT
SPONGE LAP 4X18 RFD (DISPOSABLE) IMPLANT
SPONGE SURGIFOAM ABS GEL 100 (HEMOSTASIS) ×3 IMPLANT
STAPLER VISISTAT (STAPLE) IMPLANT
STRIP CLOSURE SKIN 1/2X4 (GAUZE/BANDAGES/DRESSINGS) ×1 IMPLANT
SUT NURALON 4 0 TR CR/8 (SUTURE) IMPLANT
SUT PROLENE 3 0 PS 2 (SUTURE) ×2 IMPLANT
SUT VIC AB 1 CT1 27 (SUTURE) ×6
SUT VIC AB 1 CT1 27XBRD ANBCTR (SUTURE) IMPLANT
SUT VIC AB 1 CT1 27XBRD ANTBC (SUTURE) IMPLANT
SUT VIC AB 1-0 CT2 27 (SUTURE) IMPLANT
SUT VIC AB 2-0 CT1 27 (SUTURE)
SUT VIC AB 2-0 CT1 TAPERPNT 27 (SUTURE) IMPLANT
SUT VIC AB 2-0 CT2 27 (SUTURE) ×2 IMPLANT
SYR 3ML LL SCALE MARK (SYRINGE) ×3 IMPLANT
TOWEL GREEN STERILE (TOWEL DISPOSABLE) ×3 IMPLANT
TOWEL GREEN STERILE FF (TOWEL DISPOSABLE) ×3 IMPLANT
TRAY FOLEY CATH 16FRSI W/METER (SET/KITS/TRAYS/PACK) ×2 IMPLANT
YANKAUER SUCT BULB TIP NO VENT (SUCTIONS) ×3 IMPLANT

## 2017-12-16 NOTE — Transfer of Care (Signed)
Immediate Anesthesia Transfer of Care Note  Patient: [name]  Procedure(s) Performed: Microlumbar decompression Lumbar four-five, bilateral lumbar four-five foraminotomy (N/A Spine Lumbar)  Patient Location: PACU  Anesthesia Type:General  Level of Consciousness: awake, alert  and oriented  Airway & Oxygen Therapy: Patient Spontanous Breathing and Patient connected to nasal cannula oxygen  Post-op Assessment: Report given to RN, Post -op Vital signs reviewed and stable and Patient moving all extremities X 4  Post vital signs: Reviewed and stable  Last Vitals:  Vitals Value Taken Time  BP    Temp    Pulse 90 12/16/2017  1:59 PM  Resp 11 12/16/2017  1:59 PM  SpO2 99 % 12/16/2017  1:59 PM  Vitals shown include unvalidated device data.  Last Pain:  Vitals:   12/16/17 0814  TempSrc: Oral         Complications: No apparent anesthesia complications

## 2017-12-16 NOTE — Anesthesia Procedure Notes (Addendum)
Procedure Name: Intubation Date/Time: 12/16/2017 11:19 AM Performed by: Neldon Newport, CRNA Pre-anesthesia Checklist: Timeout performed, Patient being monitored, Suction available, Emergency Drugs available and Patient identified Patient Re-evaluated:Patient Re-evaluated prior to induction Oxygen Delivery Method: Circle system utilized Preoxygenation: Pre-oxygenation with 100% oxygen Induction Type: IV induction Ventilation: Mask ventilation without difficulty Laryngoscope Size: Mac and 3 Grade View: Grade I Tube type: Oral Tube size: 7.0 mm Number of attempts: 1 Placement Confirmation: positive ETCO2 and ETT inserted through vocal cords under direct vision Secured at: 21 cm Tube secured with: Tape Dental Injury: Teeth and Oropharynx as per pre-operative assessment

## 2017-12-16 NOTE — Interval H&P Note (Signed)
History and Physical Interval Note:  12/16/2017 11:01 AM  [name]  has presented today for surgery, with the diagnosis of Spinal stenosis L4-5, spondylolisthesis  The various methods of treatment have been discussed with the patient and family. After consideration of risks, benefits and other options for treatment, the patient has consented to  Procedure(s) with comments: Microlumbar decompression L4-5 (N/A) - 2 hrs as a surgical intervention .  The patient's history has been reviewed, patient examined, no change in status, stable for surgery.  I have reviewed the patient's chart and labs.  Questions were answered to the patient's satisfaction.     Tamon Parkerson C

## 2017-12-16 NOTE — Discharge Instructions (Signed)

## 2017-12-16 NOTE — Op Note (Signed)
NAME: [name], [name] MEDICAL RECORD ZO:[id number] ACCOUNT [id number] DATE OF BIRTH:January 15, 1971 FACILITY: [facility] LOCATION: [facility] PHYSICIAN:Dam Ashraf Connye Burkitt, MD  OPERATIVE REPORT  DATE OF PROCEDURE:  12/16/2017  PREOPERATIVE DIAGNOSIS:  Spinal stenosis at L4-5.  POSTOPERATIVE DIAGNOSIS:  Spinal stenosis at L4-5.  PROCEDURE PERFORMED:  Microlumbar decompression L4-5 bilaterally with bilateral L4 and L5 foraminotomies.  ANESTHESIA:  General.  ASSISTANT:  Andrez Grime, PA   HISTORY AND INDICATION:  A pleasant 47 year old female with severe bilateral L5 radiculopathy secondary to spinal stenosis at L4-5 due to facet hypertrophy, stable degenerative listhesis at L4-L5.  She had no back pain or leg pain.  EHL weakness.   Neurotension signs.  With an MRI indicating a large facet with osteophytes projecting medially compressing the roots in the lateral recess.  She was indicated for decompression failing conservative treatment.  Risks and benefits discussed including  bleeding, infection, damage to neurovascular structures, no change in symptoms, worsening symptoms, DVT, PE, anesthetic complications, etc.  I discussed also preoperative consideration of associated spinal fusion.  I discussed this with my partner, Dr.  Shon Baton.  We felt given that she had no back pain or leg pain and there was not instability in flexion and extension that a decompression without the concomitant fusion would be appropriate, although we discussed the possibility of requiring a fusion in  the future.  TECHNIQUE:  The patient in supine position.  After induction of adequate general anesthesia, 2 grams Kefzol she was placed prone on the [place] table with a stirrup support.  Lumbar region was prepped and draped in the usual sterile fashion.  Two  18-gauge spinal needles were utilized to localize 4-5 interspace confirmed with x-ray.  Incision was made from the spinous process of 4 to below 5.  Subcutaneous tissue was  dissected and electrocautery was utilized to achieve hemostasis.  Marcaine 0.25%  with epinephrine was infiltrated in the paraspinous musculature.  The dorsal lumbar fascia divided in line with the skin incision.  Paraspinous muscle elevated from the lamina of 5 and S1.  McCullough retractor was placed.  Operating microscope was  draped and brought  into the surgical field.  Confirmatory radiograph was obtained with the Kocher's on the spinous process of 4 and 5.  Leksell rongeur was utilized to remove a portion of the spinous process of 4 and 5.  A micro curette was utilized to  detach ligamentum flavum from the cephalad edge of 5 and the caudad edge of 4.  Hemilaminotomies of the caudad edge of 4 were performed bilaterally.  The patient had a very small interlaminar window and had a hypertrophic facet precluding separate  hemilaminotomies.  Next, first on the right, I identified the facet at 4-5.  This was fairly hypertrophic.  We meticulously developed a plane between the facet and the neural elements.  First cephalad, performed a foraminotomy of L4 after the  hemilaminotomies and with a Woodson retractor developing that plane between the thecal sac and the nerve root and the facet.  I decompressed the lateral recess to the medial border of the pedicle.  The 5 root was significantly compressed by the facet and  the superior articulating process of 5 and by that facet hypertrophy.  Somewhat flattening of the nerve root.  I felt the requirement to obtain a 1 mm Kerrison and used the FA-20 micro curette to meticulously performe a foraminotomy without compressing  the nerve root.  In addition, there was some shingling of the lamina of 5 that required a  hemilaminotomy of the cephalad edge of 5 as well.  There were some adhesions of the facet and synovial tissue that were lysed.  Bipolar cautery was utilized to  achieve hemostasis.  There was no disk herniation.  Some ligamentum in the foramen of 4 and this was  removed after protecting the 4 root.  Similarly on the left side, she had a fairly large facet with an osteophytic spur medially.  We removed a small  portion of the inferior process of 4 identifying the superior articulating process of 5.  Again skeletonizing that and developing a plane between the thecal sac, the L5 nerve root and the superior articulating facet.  Again, first starting with a  foraminotomy for first protecting the 4 root, preserving the pars bilaterally.  I then decompressed the lateral recess, the medial border of the pedicle again fairly stenotic and the L5 foramen.  We used a 1 mm Kerrison and protected the nerve root  carefully and performed a generous foraminotomy.  Following the decompression, the neuro probe passed freely at the foramen of 4 and 5 bilaterally above the pedicle of 4 and below the pedicle of 5.  No evidence of CSF leakage or active bleeding.  No disk  herniation was noted.  Copious irrigation with antibiotic irrigation, bone wax on cancellous surfaces and a small thrombin soaked Gelfoam patty was placed after confirmatory radiograph was obtained with markers in the foramen of 4 and 5.  Bipolar  cautery was utilized to achieve strict hemostasis.  Again, a neuro probe passed freely out the foramen of 5 and 4 and centrally.  I felt this was well decompressed.  Next, we removed the [facility] ret[facility], irrigated the paraspinous musculature.  No  active bleeding.  Closed the dorsal lumbar fascia with #1 Vicryl.  The small aperture of proximally a centimeter distal to allow for any drainage if needed.  Subcutaneous with 2-0 and skin with Prolene.  Sterile dressing applied.  We placed supine on the  hospital bed, extubated without difficulty and transported to the recovery room in satisfactory condition.  The patient tolerated the procedure well.  No complications.  Assisted by Andrez GrimeJaclyn Bissell, PA.  Blood loss was 100 mL.  TN/NUANCE  D:12/16/2017 T:12/16/2017  JOB:[id number]

## 2017-12-16 NOTE — Anesthesia Postprocedure Evaluation (Signed)
Anesthesia Post Note  Patient: Thurmon Fairara D Crumble  Procedure(s) Performed: Microlumbar decompression Lumbar four-five, bilateral lumbar four-five foraminotomy (N/A Spine Lumbar)     Patient location during evaluation: PACU Anesthesia Type: General Level of consciousness: awake and alert Pain management: pain level controlled Vital Signs Assessment: post-procedure vital signs reviewed and stable Respiratory status: spontaneous breathing, nonlabored ventilation, respiratory function stable and patient connected to nasal cannula oxygen Cardiovascular status: blood pressure returned to baseline and stable Postop Assessment: no apparent nausea or vomiting Anesthetic complications: no    Last Vitals:  Vitals:   12/16/17 1356 12/16/17 1400  BP:  (!) 146/90  Pulse:  90  Resp:  11  Temp: (!) 36.4 C   SpO2:  99%    Last Pain:  Vitals:   12/16/17 1400  TempSrc:   PainSc: 0-No pain                 Maisie Hauser

## 2017-12-16 NOTE — Brief Op Note (Signed)
12/16/2017  5:56 PM  PATIENT:  Thurmon Fairara D Carlini  47 y.o. female  PRE-OPERATIVE DIAGNOSIS:  Spinal stenosis L4-5, spondylolisthesis  POST-OPERATIVE DIAGNOSIS:  Spinal stenosis L4-5, spondylolisthesis  PROCEDURE:  Procedure(s): Microlumbar decompression Lumbar four-five, bilateral lumbar four-five foraminotomy (N/A)  SURGEON:  Surgeon(s) and Role:    Jene Every* Marguis Mathieson, MD - Primary  PHYSICIAN ASSISTANT:   ASSISTANTS: Bissell   ANESTHESIA:   general  EBL:  150 mL   BLOOD ADMINISTERED:none  DRAINS: none   LOCAL MEDICATIONS USED:  MARCAINE     SPECIMEN:  No Specimen  DISPOSITION OF SPECIMEN:  N/A  COUNTS:  YES  TOURNIQUET:  * No tourniquets in log *  DICTATION: .Other Dictation: Dictation Number [id number]  PLAN OF CARE: Admit for overnight observation  PATIENT DISPOSITION:  PACU - hemodynamically stable.   Delay start of Pharmacological VTE agent (>24hrs) due to surgical blood loss or risk of bleeding: yes

## 2017-12-17 ENCOUNTER — Encounter (HOSPITAL_COMMUNITY): Payer: Self-pay | Admitting: Specialist

## 2017-12-17 DIAGNOSIS — M48061 Spinal stenosis, lumbar region without neurogenic claudication: Secondary | ICD-10-CM | POA: Diagnosis not present

## 2017-12-17 MED FILL — Thrombin (Recombinant) For Soln 20000 Unit: CUTANEOUS | Qty: 1 | Status: AC

## 2017-12-17 NOTE — Evaluation (Signed)
Occupational Therapy Evaluation Patient Details Name: [name] MRN: [id number] DOB: 08/19/1970 Today's Date: 12/17/2017    History of Present Illness Pt is a 47 y.o. female s/p decompression L4-5, bilat L4-5 foraminotomy on 12/16/17. PMH includes HTN, arthritis.   Clinical Impression   Patient evaluated by Occupational Therapy with no further acute OT needs identified. All education has been completed and the patient has no further questions. See below for any follow-up Occupational Therapy or equipment needs. OT to sign off. Thank you for referral.      Follow Up Recommendations  No OT follow up    Equipment Recommendations  None recommended by OT    Recommendations for Other Services       Precautions / Restrictions Precautions Precautions: Back Precaution Comments: provided handout and reviewed for adls with back precautions Restrictions Weight Bearing Restrictions: No      Mobility Bed Mobility Overal bed mobility: Independent             General bed mobility comments: Received standing with OT  Transfers Overall transfer level: Independent Equipment used: None                  Balance Overall balance assessment: No apparent balance deficits (not formally assessed)                                         ADL either performed or assessed with clinical judgement   ADL Overall ADL's : Independent                                       General ADL Comments: pt able to cross figure 4, pt able to verbalize back precautions, pt demonstrates shower transfer and able to stand vision occluded,completed bed mobility   Back handout provided and reviewed adls in detail. Pt educated on: avoid sitting for long periods of time, correct bed positioning for sleeping, correct sequence for bed mobility, avoiding lifting more than 5 pounds and never wash directly over incision. All education is complete and patient indicates  understanding.     Vision Baseline Vision/History: Wears glasses Wears Glasses: Reading only       Perception     Praxis      Pertinent Vitals/Pain Pain Assessment: Faces Pain Score: 2  Faces Pain Scale: Hurts a little bit Pain Location: back Pain Descriptors / Indicators: Operative site guarding Pain Intervention(s): Monitored during session;Premedicated before session;Repositioned     Hand Dominance Right   Extremity/Trunk Assessment Upper Extremity Assessment Upper Extremity Assessment: Overall WFL for tasks assessed   Lower Extremity Assessment Lower Extremity Assessment: Overall WFL for tasks assessed   Cervical / Trunk Assessment Cervical / Trunk Assessment: Other exceptions Cervical / Trunk Exceptions: s/p surgery   Communication Communication Communication: No difficulties   Cognition Arousal/Alertness: Awake/alert Behavior During Therapy: WFL for tasks assessed/performed Overall Cognitive Status: Within Functional Limits for tasks assessed                                 General Comments: w   General Comments  pt spoke with MD on the phone at the start of session    Exercises     Shoulder Instructions      Home Living  Family/patient expects to be discharged to:: Private residence Living Arrangements: Spouse/significant other Available Help at Discharge: Family;Available 24 hours/day Type of Home: House Home Access: Stairs to enter [employer] of Steps: 5 Entrance Stairs-Rails: Right;Left Home Layout: Two level Alternate Level Stairs-Number of Steps: Flight Alternate Level Stairs-Rails: Right Bathroom Shower/Tub: Producer, television/film/video: Standard     Home Equipment: None          Prior Functioning/Environment Level of Independence: Independent        Comments: Works as Agricultural consultant Problem List:        OT Treatment/Interventions:      OT Goals(Current goals can be  found in the care plan section) Acute Rehab OT Goals Patient Stated Goal: to do online class workd  OT Frequency:     Barriers to D/C:            Co-evaluation              AM-PAC PT "6 Clicks" Daily Activity     Outcome Measure Help from another person eating meals?: None Help from another person taking care of personal grooming?: None Help from another person toileting, which includes using toliet, bedpan, or urinal?: None Help from another person bathing (including washing, rinsing, drying)?: None Help from another person to put on and taking off regular upper body clothing?: None Help from another person to put on and taking off regular lower body clothing?: None 6 Click Score: 24   End of Session Nurse Communication: Mobility status  Activity Tolerance: Patient tolerated treatment well Patient left: Other (comment)(in hall with PT Lockie Pares)  OT Visit Diagnosis: Unsteadiness on feet (R26.81)                Time: 1610-9604 OT Time Calculation (min): 16 min Charges:  OT General Charges $OT Visit: 1 Visit OT Evaluation $OT Eval Low Complexity: 1 Low   Mateo Flow   OTR/L Pager: [phone] Office: [phone] .   Boone Master B 12/17/2017, 9:04 AM

## 2017-12-17 NOTE — Discharge Summary (Signed)
Physician Discharge Summary   Patient ID: [name] MRN: [id number] DOB/AGE: [birth year] 47 y.o.  Admit date: 12/16/2017 Discharge date: 12/17/2017  Primary Diagnosis:   Spinal stenosis L4-5, spondylolisthesis  Admission Diagnoses:  Past Medical History:  Diagnosis Date  . Arthritis   . Hypertension   . Neuromuscular disorder [facility])    Discharge Diagnoses:   Principal Problem:   Spinal stenosis of lumbar region Active Problems:   Lumbar spinal stenosis  Procedure:  Procedure(s) (LRB): Microlumbar decompression Lumbar four-five, bilateral lumbar four-five foraminotomy (N/A)   Consults: None  HPI:  see H&P    Laboratory Data: Hospital Outpatient Visit on 12/10/2017  Component Date Value Ref Range Status  . MRSA, PCR 12/10/2017 NEGATIVE  NEGATIVE Final  . Staphylococcus aureus 12/10/2017 NEGATIVE  NEGATIVE Final   Comment: (NOTE) The Xpert SA Assay (FDA approved for NASAL specimens in patients 27 years of age and older), is one component of a comprehensive surveillance program. It is not intended to diagnose infection nor to guide or monitor treatment. Performed at [facility], [place] [address], [place], [place] [address]   . Sodium 12/10/2017 138  135 - 145 mmol/L Final  . Potassium 12/10/2017 3.7  3.5 - 5.1 mmol/L Final  . Chloride 12/10/2017 108  98 - 111 mmol/L Final   Please note change in reference range.  . CO2 12/10/2017 21* 22 - 32 mmol/L Final  . Glucose, Bld 12/10/2017 113* 70 - 99 mg/dL Final   Please note change in reference range.  . BUN 12/10/2017 14  6 - 20 mg/dL Final   Please note change in reference range.  . Creatinine, Ser 12/10/2017 0.91  0.44 - 1.00 mg/dL Final  . Calcium 12/10/2017 9.5  8.9 - 10.3 mg/dL Final  . GFR calc non Af Amer 12/10/2017 >60  >60 mL/min Final  . GFR calc Af Amer 12/10/2017 >60  >60 mL/min Final   Comment: (NOTE) The eGFR has been calculated using the CKD EPI equation. This calculation has not been  validated in all clinical situations. eGFR's persistently <60 mL/min signify possible Chronic Kidney Disease.   Georgiann Hahn gap 12/10/2017 9  5 - 15 Final   Performed at [facility], [place] [address] [place], [place] [address]  . WBC 12/10/2017 8.3  4.0 - 10.5 K/uL Final  . RBC 12/10/2017 4.82  3.87 - 5.11 MIL/uL Final  . Hemoglobin 12/10/2017 13.4  12.0 - 15.0 g/dL Final  . HCT 12/10/2017 40.4  36.0 - 46.0 % Final  . MCV 12/10/2017 83.8  78.0 - 100.0 fL Final  . MCH 12/10/2017 27.8  26.0 - 34.0 pg Final  . MCHC 12/10/2017 33.2  30.0 - 36.0 g/dL Final  . RDW 12/10/2017 12.5  11.5 - 15.5 % Final  . Platelets 12/10/2017 333  150 - 400 K/uL Final   Performed at [facility], [place] [address], [place], [place] [address]   No results for input(s): HGB in the last 72 hours. No results for input(s): WBC, RBC, HCT, PLT in the last 72 hours. No results for input(s): NA, K, CL, CO2, BUN, CREATININE, GLUCOSE, CALCIUM in the last 72 hours. No results for input(s): LABPT, INR in the last 72 hours.  X-Rays:Dg Lumbar Spine 2-3 Views  Result Date: 12/16/2017 CLINICAL DATA:  Intraoperative localization EXAM: LUMBAR SPINE - 2-3 VIEW COMPARISON:  12/10/2017 FINDINGS: Initial lateral view of the lumbar spine was obtained and reveals needles within the posterior soft tissues at  the L3-4 and L5 level. Subsequent image demonstrates surgical retractors posterior to L5. The third image demonstrates additional surgical instruments adjacent to the L4-5 disc space within the spinal canal. IMPRESSION: Intraoperative lumbar localization. Electronically Signed   By: Inez Catalina M.D.   On: 12/16/2017 13:35   Dg Lumbar Spine 2-3 Views  Result Date: 12/10/2017 CLINICAL DATA:  Preop for lumbar decompression. EXAM: LUMBAR SPINE - 2-3 VIEW COMPARISON:  None. FINDINGS: There is no evidence of lumbar spine fracture. There is grade 1 anterolisthesis at L4-L5 with suspected pars interarticularis defects. Intervertebral  disc spaces are maintained. 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 Electronically Signed   By: Ulyses Jarred M.D.   On: 12/10/2017 22:41    EKG: Orders placed or performed in visit on 12/10/17  . EKG 12-Lead     Hospital Course: Patient was admitted to [facility] and taken to the OR and underwent the above state procedure without complications.  Patient tolerated the procedure well and was later transferred to the recovery room and then to the orthopaedic floor for postoperative care.  They were given PO and IV analgesics for pain control following their surgery.  They were given 24 hours of postoperative antibiotics.   PT was consulted postop to assist with mobility and transfers.  The patient was allowed to be WBAT with therapy and was taught back precautions. Discharge planning was consulted to help with postop disposition and equipment needs.  Patient had a good night on the evening of surgery and started to get up OOB with therapy on day one. Patient was seen in rounds and was ready to go home on day one.  They were given discharge instructions and dressing directions.  They were instructed on when to follow up in the office with Dr. Tonita Cong.   Diet: Regular diet Activity:WBAT, Lspine precautions Follow-up:in 10-14 days Disposition - Home Discharged Condition: good   Discharge Instructions    Call MD / Call 911   Complete by:  As directed    If you experience chest pain or shortness of breath, CALL 911 and be transported to the hospital emergency room.  If you develope a fever above 101 F, pus (white drainage) or increased drainage or redness at the wound, or calf pain, call your surgeon's office.   Constipation Prevention   Complete by:  As directed    Drink plenty of fluids.  Prune juice may be helpful.  You may use a stool softener, such as Colace (over the counter) 100 mg twice a day.  Use MiraLax (over the counter) for  constipation as needed.   Diet - low sodium heart healthy   Complete by:  As directed    Increase activity slowly as tolerated   Complete by:  As directed      Allergies as of 12/17/2017      Reactions   Penicillins Rash   Has patient had a PCN reaction causing immediate rash, facial/tongue/throat swelling, SOB or lightheadedness with hypotension: Unknown Has patient had a PCN reaction causing severe rash involving mucus membranes or skin necrosis: Unknown Has patient had a PCN reaction that required hospitalization: No Has patient had a PCN reaction occurring within the last 10 years: No Childhood  If all of the above answers are "NO", then may proceed with Cephalosporin use.      Medication List    STOP taking these medications   oxyCODONE-acetaminophen 5-325 MG tablet Commonly known as:  PERCOCET/ROXICET  VIMOVO 500-20 MG Tbec Generic drug:  Naproxen-Esomeprazole     TAKE these medications   aspirin EC 81 MG tablet Take 1 tablet (81 mg total) by mouth daily. Resume 4 days post-op What changed:  additional instructions   CALCIUM 600+D PO Take 1 tablet by mouth daily.   docusate sodium 100 MG capsule Commonly known as:  COLACE Take 1 capsule (100 mg total) by mouth 2 (two) times daily.   gabapentin 300 MG capsule Commonly known as:  NEURONTIN Take 600 mg by mouth 3 (three) times daily.   KLS ALLERCLEAR 10 MG tablet Generic drug:  loratadine Take 10 mg by mouth daily.   lisinopril 5 MG tablet Commonly known as:  PRINIVIL,ZESTRIL Take 5 mg by mouth daily.   methocarbamol 500 MG tablet Commonly known as:  ROBAXIN Take 1 tablet (500 mg total) by mouth every 6 (six) hours as needed for muscle spasms.   multivitamin with minerals Tabs tablet Take 1 tablet by mouth daily. One-A-Day Women's   NIFEdipine 60 MG 24 hr tablet Commonly known as:  PROCARDIA XL/ADALAT-CC Take 60 mg by mouth daily.   oxyCODONE 5 MG immediate release tablet Commonly known as:   ROXICODONE Take 1-2 tablets (5-10 mg total) by mouth every 4 (four) hours as needed.   polyethylene glycol packet Commonly known as:  MIRALAX Take 17 g by mouth daily.   vitamin C 500 MG tablet Commonly known as:  ASCORBIC ACID Take 500 mg by mouth daily.      Follow-up Information    Susa Day, MD Follow up in 2 week(s).   Specialty:  Orthopedic Surgery Contact information: [address] [place] [address] [phone]           Signed: Lacie Draft, PA-C Orthopaedic Surgery 12/17/2017, 8:11 AM

## 2017-12-17 NOTE — Progress Notes (Signed)
Patient is discharged from room 3C11 at this time. Alert and in stable condition. IV site d/c'd and instructions read to patient and family with understanding verbalized. Left unit via wheelchair with all belongings at side. 

## 2017-12-17 NOTE — Evaluation (Signed)
Physical Therapy Evaluation & Discharge Patient Details Name: [name] MRN: [id number] DOB: Oct 09, 1970 Today's Date: 12/17/2017   History of Present Illness  Pt is a 47 y.o. female s/p decompression L4-5, bilat L4-5 foraminotomy on 12/16/17. PMH includes HTN, arthritis.  Clinical Impression  Patient evaluated by Physical Therapy with no further acute PT needs identified. PTA, pt indep and works. Today, pt indep with transfers, ambulation, and stairs. Good recall and awareness of precautions. All education has been completed and the patient has no further questions. PT is signing off. Thank you for this referral.    Follow Up Recommendations No PT follow up    Equipment Recommendations  None recommended by PT    Recommendations for Other Services       Precautions / Restrictions Precautions Precautions: Back Precaution Comments: Verbally reviewed precautions Restrictions Weight Bearing Restrictions: No      Mobility  Bed Mobility               General bed mobility comments: Received standing with OT  Transfers Overall transfer level: Independent Equipment used: None                Ambulation/Gait Ambulation/Gait assistance: Independent Gait Distance (Feet): 400 Feet Assistive device: None Gait Pattern/deviations: WFL(Within Functional Limits)   Gait velocity interpretation: >2.62 ft/sec, indicative of community ambulatory    Stairs Stairs: Yes Stairs assistance: Modified independent (Device/Increase time) Stair Management: One rail Right;No rails;Forwards;Alternating pattern Number of Stairs: 20 General stair comments: Indep with and without rail support  Wheelchair Mobility    Modified Rankin (Stroke Patients Only)       Balance Overall balance assessment: No apparent balance deficits (not formally assessed)                                           Pertinent Vitals/Pain Pain Assessment: Faces Faces Pain Scale:  Hurts a little bit Pain Location: Lumbar incision Pain Descriptors / Indicators: Dull Pain Intervention(s): Monitored during session    Home Living Family/patient expects to be discharged to:: Private residence Living Arrangements: Spouse/significant other Available Help at Discharge: Family;Available 24 hours/day Type of Home: House Home Access: Stairs to enter Entrance Stairs-Rails: Doctor, general practice of Steps: 5 Home Layout: Two level        Prior Function Level of Independence: Independent         Comments: Works as Public house manager        Extremity/Trunk Assessment   Upper Extremity Assessment Upper Extremity Assessment: Overall WFL for tasks assessed    Lower Extremity Assessment Lower Extremity Assessment: Overall WFL for tasks assessed       Communication   Communication: No difficulties  Cognition Arousal/Alertness: Awake/alert Behavior During Therapy: WFL for tasks assessed/performed Overall Cognitive Status: Within Functional Limits for tasks assessed                                        General Comments      Exercises     Assessment/Plan    PT Assessment Patent does not need any further PT services  PT Problem List         PT Treatment Interventions      PT Goals (Current goals can be found in the  Care Plan section)  Acute Rehab PT Goals PT Goal Formulation: All assessment and education complete, DC therapy    Frequency     Barriers to discharge        Co-evaluation               AM-PAC PT "6 Clicks" Daily Activity  Outcome Measure Difficulty turning over in bed (including adjusting bedclothes, sheets and blankets)?: None Difficulty moving from lying on back to sitting on the side of the bed? : None Difficulty sitting down on and standing up from a chair with arms (e.g., wheelchair, bedside commode, etc,.)?: None Help needed moving to and from a bed to chair  (including a wheelchair)?: None Help needed walking in hospital room?: None Help needed climbing 3-5 steps with a railing? : None 6 Click Score: 24    End of Session Equipment Utilized During Treatment: Gait belt Activity Tolerance: Patient tolerated treatment well Patient left: in chair;with call bell/phone within reach Nurse Communication: Mobility status PT Visit Diagnosis: Other abnormalities of gait and mobility (R26.89)    Time: [id number] PT Time Calculation (min) (ACUTE ONLY): 8 min   Charges:   PT Evaluation $PT Eval Low Complexity: 1 Low         Ina HomesJaclyn Raydan Schlabach, PT, DPT Acute Rehab Services  Pager: [phone]  Malachy ChamberJaclyn L Joci Dress 12/17/2017, 8:33 AM

## 2017-12-17 NOTE — Progress Notes (Signed)
Subjective: 1 Day Post-Op Procedure(s) (LRB): Microlumbar decompression Lumbar four-five, bilateral lumbar four-five foraminotomy (N/A) Patient reports pain as moderate. Reports back pain. Noted pain into B/L hips overnight. Pain in back when walking. No leg pain, numbness, tingling. Voiding without difficulty, feels she is fully emptying. No BM yet. + Flatus. Feels ready for D/C home. No other c/o.   Objective: Vital signs in last 24 hours: Temp:  [97.5 F (36.4 C)-98.7 F (37.1 C)] 98.4 F (36.9 C) (07/26 0328) Pulse Rate:  [62-90] 72 (07/26 0328) Resp:  [7-20] 18 (07/26 0328) BP: (114-168)/(66-90) 128/81 (07/26 0328) SpO2:  [98 %-100 %] 98 % (07/26 0328) Weight:  [99.9 kg (220 lb 4.8 oz)] 99.9 kg (220 lb 4.8 oz) (07/25 0814)  Intake/Output from previous day: 07/25 0701 - 07/26 0700 In: 2386.4 [P.O.:220; I.V.:1766.4; IV Piggyback:400] Out: 375 [Urine:225; Blood:150] Intake/Output this shift: No intake/output data recorded.  No results for input(s): HGB in the last 72 hours. No results for input(s): WBC, RBC, HCT, PLT in the last 72 hours. No results for input(s): NA, K, CL, CO2, BUN, CREATININE, GLUCOSE, CALCIUM in the last 72 hours. No results for input(s): LABPT, INR in the last 72 hours.  Neurologically intact ABD soft Neurovascular intact Sensation intact distally Intact pulses distally Dorsiflexion/Plantar flexion intact Incision: dressing C/D/I and no drainage No cellulitis present Compartment soft no calf pain or sign of DVT  Assessment/Plan: 1 Day Post-Op Procedure(s) (LRB): Microlumbar decompression Lumbar four-five, bilateral lumbar four-five foraminotomy (N/A) Advance diet Up with therapy D/C IV fluids Discussed D/C instructions, Lspine precautions, dressing instructions D/C home today Discussed with Dr. Shelle IronBeane Follow up outpt 10-14 days   BISSELL, JACLYN M. 12/17/2017, 8:09 AM

## 2019-01-19 IMAGING — CR DG LUMBAR SPINE 2-3V
3 series · 3 of 3 positions shown · non-contrast
Comparison: None.

CLINICAL DATA: Preop for lumbar decompression.

EXAM:
LUMBAR SPINE - 2-3 VIEW

[w lumbar spine ap]
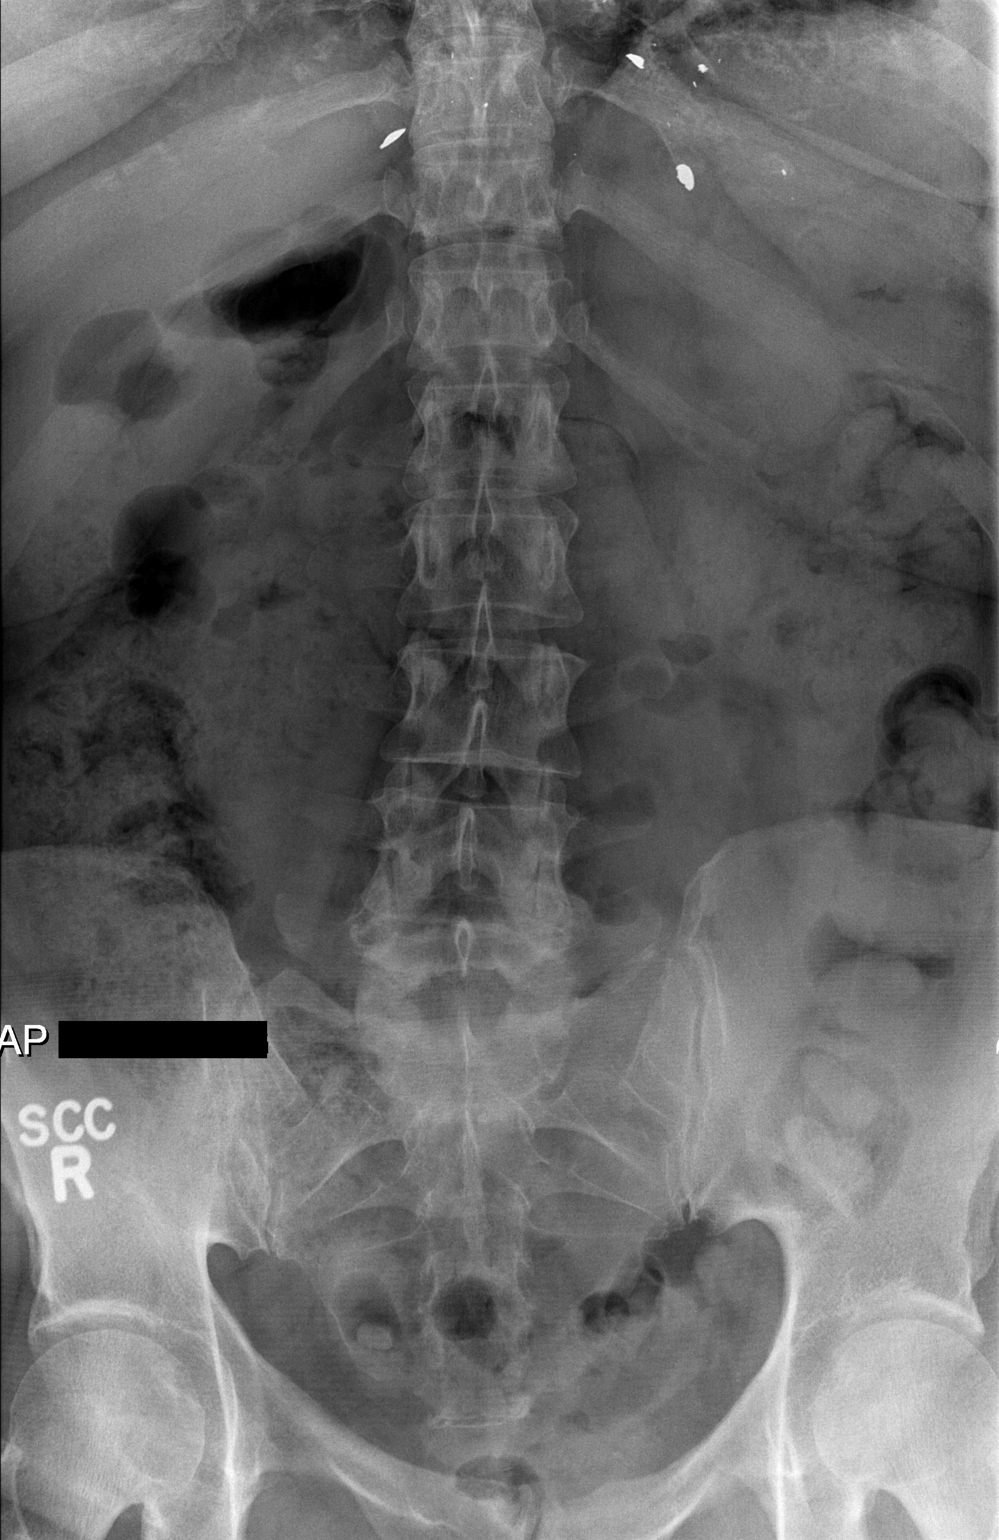

[w lumbar spine lat]
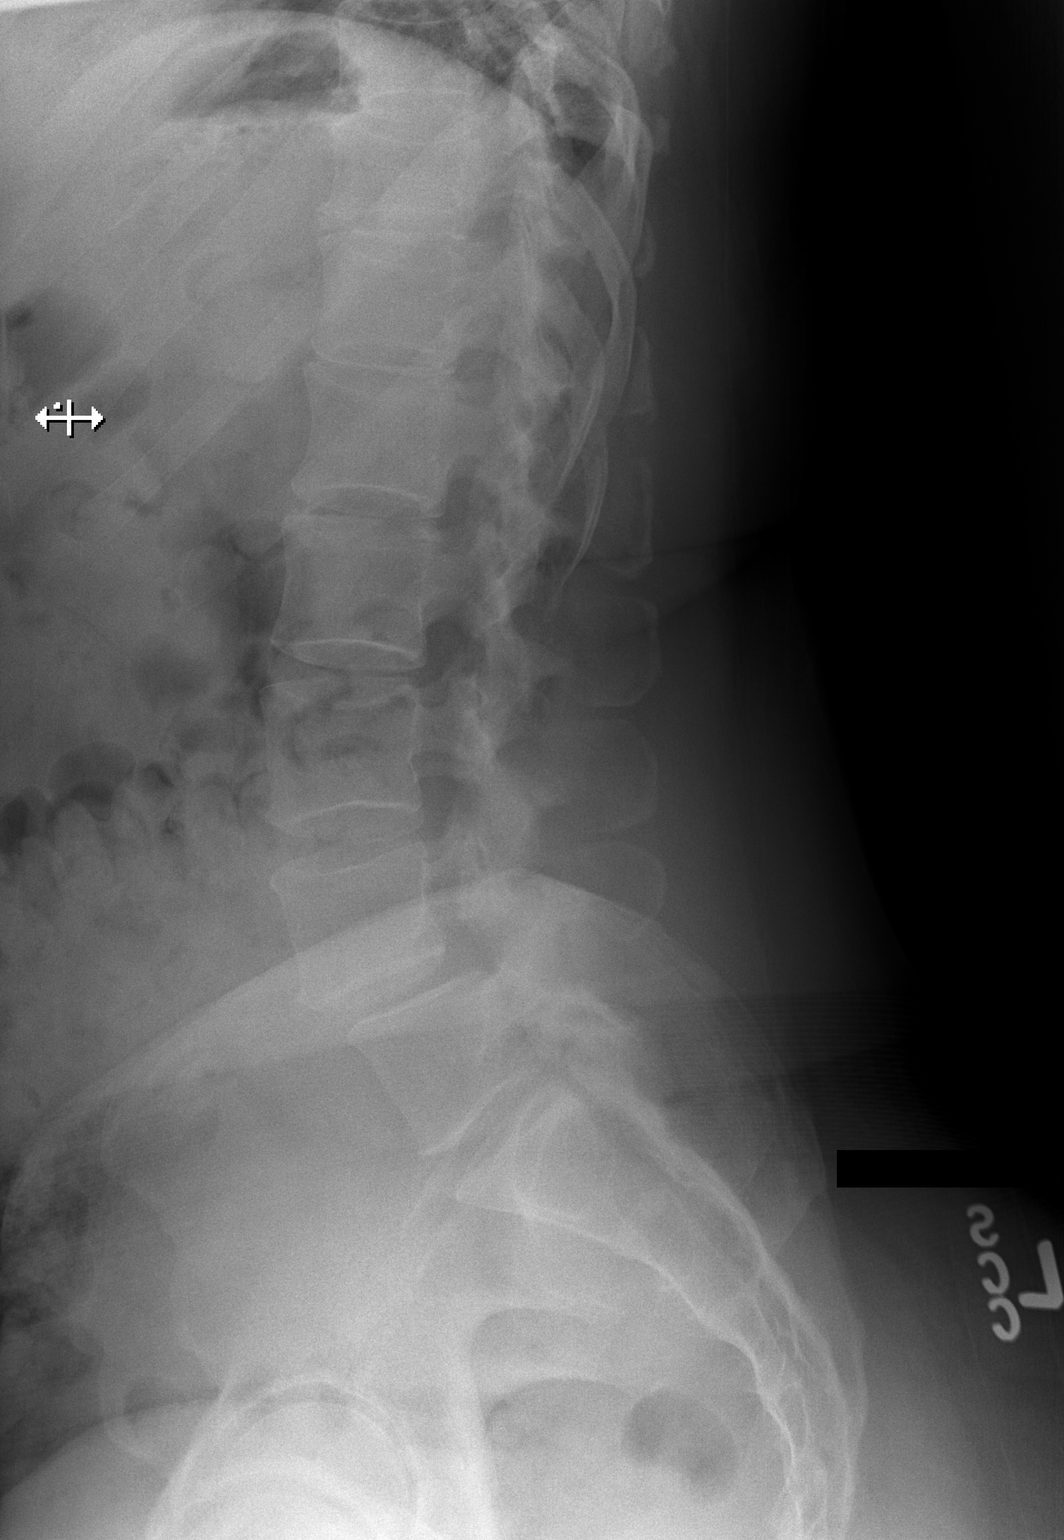

[w lumbar l-5 s-1 spot]
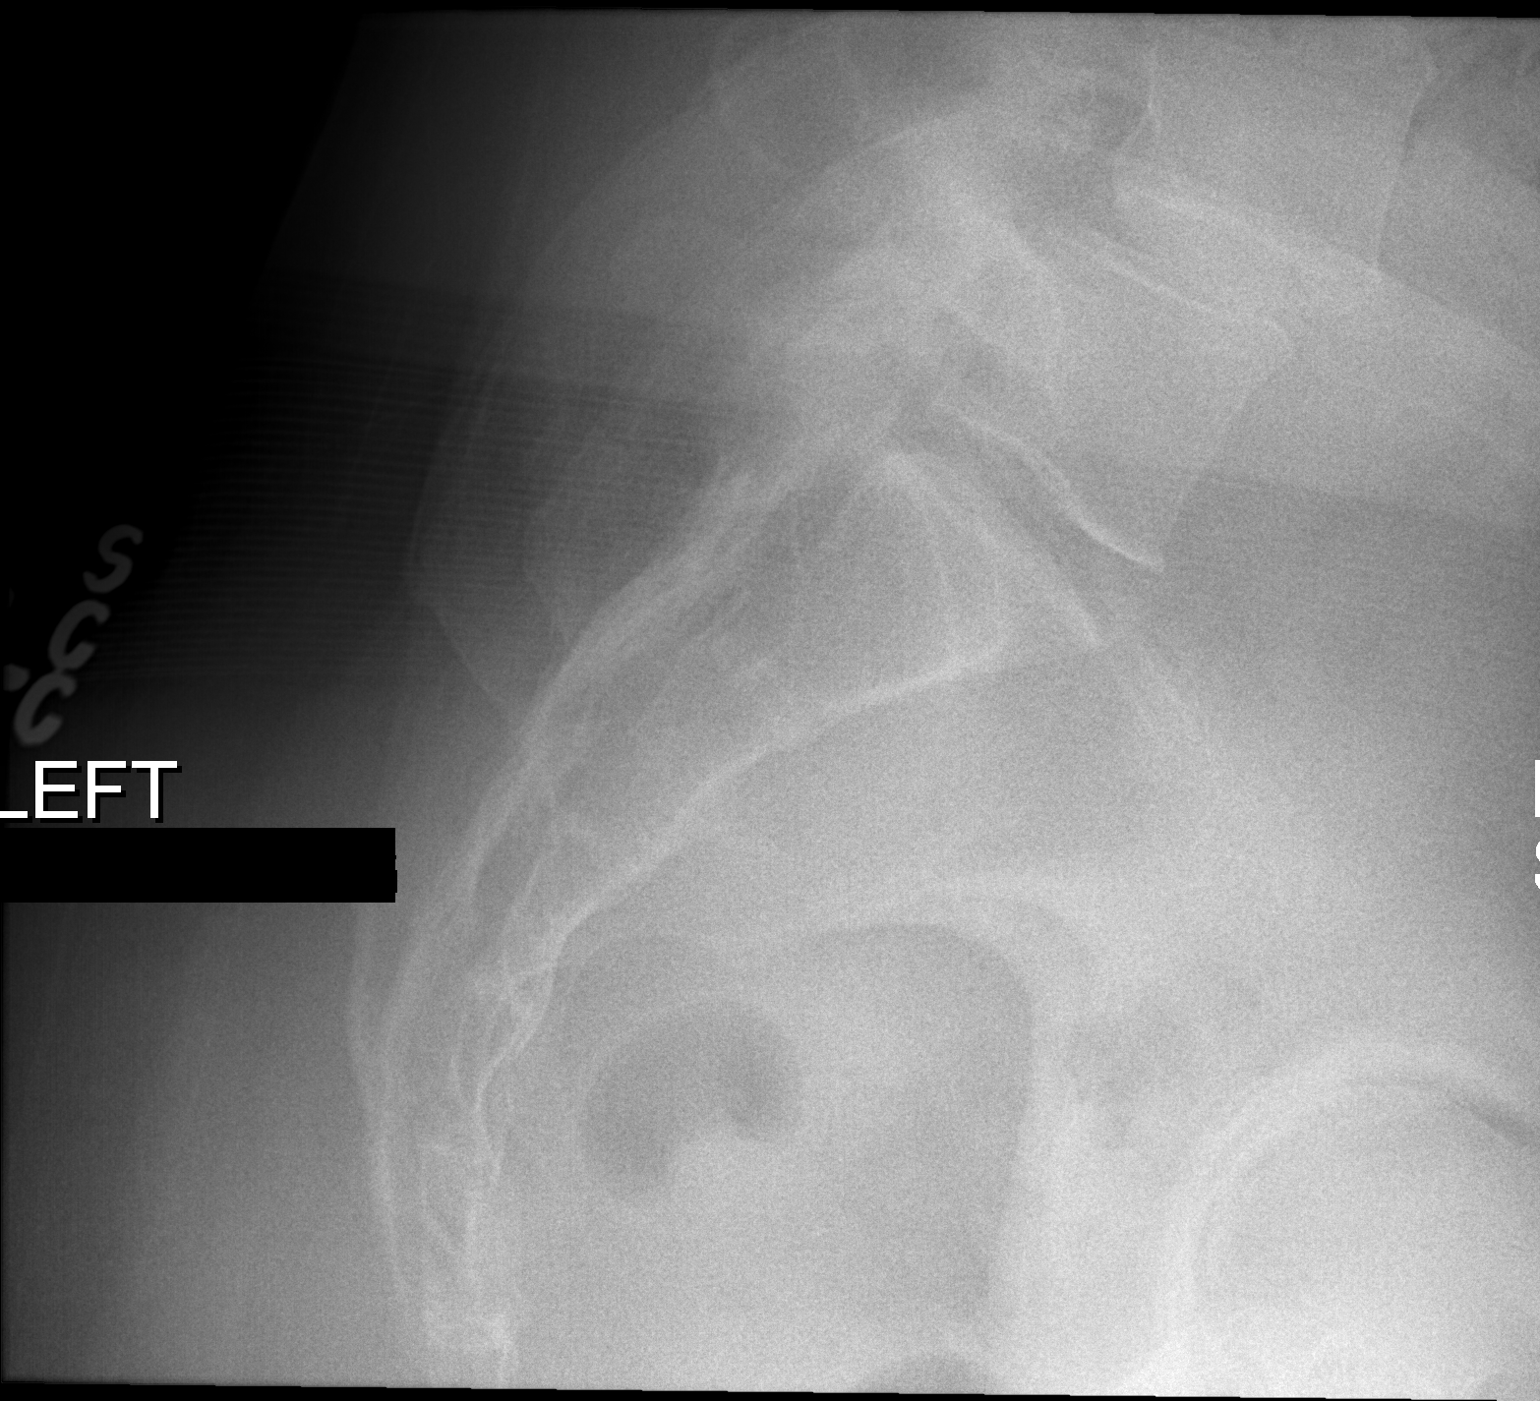

[3 of 3 positions shown; findings below may reference images not displayed]

FINDINGS: There is no evidence of lumbar spine fracture. There is grade 1
anterolisthesis at L4-L5 with suspected pars interarticularis
defects. Intervertebral disc spaces are maintained.
IMPRESSION: Grade 1 L4-5 anterolisthesis, likely due to pars interarticularis
defects.

## 2024-01-27 LAB — COLOGUARD: COLOGUARD: NEGATIVE
